# Patient Record
Sex: Male | Born: 1950 | Race: White | Hispanic: No | Marital: Married | Smoking: Former smoker
Health system: Southern US, Community
[De-identification: ages and names within clinical notes are randomized; demographics above are authoritative.]

---

## 2006-05-07 ENCOUNTER — Inpatient Hospital Stay (HOSPITAL_COMMUNITY): Admission: EM | Admit: 2006-05-07 | Discharge: 2006-05-09 | Payer: Self-pay | Admitting: Emergency Medicine

## 2006-06-13 ENCOUNTER — Encounter: Admission: RE | Admit: 2006-06-13 | Discharge: 2006-06-13 | Payer: Self-pay | Admitting: Family Medicine

## 2006-08-17 ENCOUNTER — Encounter: Admission: RE | Admit: 2006-08-17 | Discharge: 2006-08-17 | Payer: Self-pay | Admitting: Family Medicine

## 2010-07-23 NOTE — H&P (Signed)
NAMENGUYEN, TODOROV             ACCOUNT NO.:  [id number]   MEDICAL RECORD NO.:  [id number]          PATIENT TYPE:  INP   LOCATION:  1828                         FACILITY:  [facility]   PHYSICIAN:  Kela Millin, M.D.DATE OF BIRTH:  1950-03-10   DATE OF ADMISSION:  05/07/2006  DATE OF DISCHARGE:                              HISTORY & PHYSICAL   PRIMARY CARE PHYSICIAN:  Donia Guiles, M.D.   CHIEF COMPLAINT:  Presyncopal episode, cough, and fevers.   HISTORY OF PRESENT ILLNESS:  The patient is a 60 year old black male  with past medical history significant for tobacco abuse, who presents  with above complaints.  He states that he was in his usual state of  health until about a week and a half ago when he developed a cough along  with flu-like illness, generalized malaise, body aches, decreased  appetite, and subjective fevers.  After one week, he states that his  symptoms seemed to be improving, but then his condition began to decline  again.  He states that today he felt very weak and could not even get  through his shower, and his wife reported that he almost passed out,  became diaphoretic, pale.  No loss of consciousness reported.  He states  that his cough has just been occasionally productive of phlegm - clear  to white.  Admits to shortness of breath today.  He denies chest pain,  nausea or vomiting, abdominal pain, dysuria, melena, and no  hematochezia.  Per EMS, the patient was initially hypotensive on the  scene, with a blood pressure of 68/40, and received IV fluids, and upon  arrival in the ER his blood pressure was up to 105/54.  The patient was  also reported to be hypothermic per ER physician, and after a warming  blanket was used his temperature came up to 98.5.  A chest x-ray was  done in the ER, and it revealed right lower lobe pneumonia, influenza A  and B swabs negative.  His white cell count was elevated at 25.8.  He is  admitted to the [facility] for further evaluation and  management.   PAST MEDICAL HISTORY:  As above.   MEDICATIONS:  None.   ALLERGIES:  CODEINE.   SOCIAL HISTORY:  Positive for tobacco.  He smokes about 5 cigars a day.  States he has not smoked any in a week.  The patient also drinks 1-2  beers a day, but states he has not had any alcohol in greater than a  week.   FAMILY HISTORY:  His father is deceased.  He had lung cancer.  His  brother has prostate cancer.  His mother had an MI at age 46.   REVIEW OF SYSTEMS:  As per HPI.  Other review of systems negative.   PHYSICAL EXAMINATION:  GENERAL:  The patient is a middle-aged black  male.  He is alert and appropriate, in no respiratory distress.  VITAL SIGNS:  Temperature 98.5, blood pressure 109/60, pulse 89,  respiratory rate 16, initially 32, O2 saturation 97%.  HEENT:  PERRL.  EOMI.  Slightly dry mucous  membranes.  No oral exudates.  NECK:  Supple.  No adenopathy, no thyromegaly, and no JVD.  LUNGS:  Decreased breath sounds in the bases.  Moderate air movement.  No wheezes, and no crackles.  CARDIOVASCULAR:  Regular rate and rhythm.  Normal S1, S2.  ABDOMEN:  Soft.  Normal bowel sounds present.  Nontender, nondistended.  No organomegaly, and no masses palpable.  EXTREMITIES:  No cyanosis, and no edema.  NEUROLOGIC:  He is alert and oriented x3.  Cranial nerves II-XII grossly  intact.  Nonfocal exam.   LABORATORY DATA:  Chest x-ray:  Right lower lobe infiltrate, consistent  with pneumonia.  White cell count is 25.8, hemoglobin 13.3, hematocrit  38.6, platelet count 444, neutrophil count 91%.  Sodium is 138,  potassium 3.5, chloride 106, BUN 16, creatinine 1.2.  pH 7.36, PCO2  44.3.  Point of care markers negative x1.  His flu swab is negative for  influenza A and B.   ASSESSMENT AND PLAN:  1. Right lower lobe pneumonia.  Obtain blood cultures.  Continue IV      Zosyn.  Add expectorants and follow.  2. Presyncope.  Likely secondary to  hypotension initially, as      discussed above.  Resolved with IV fluids.  3. Tobacco abuse.  Smoking cessation counseling.      Kela Millin, M.D.  Electronically Signed     ACV/MEDQ  D:  05/07/2006  T:  05/07/2006  Job:  [id number]   cc:   Donia Guiles, M.D.

## 2011-09-19 ENCOUNTER — Other Ambulatory Visit: Payer: Self-pay | Admitting: Family Medicine

## 2011-09-19 DIAGNOSIS — R1310 Dysphagia, unspecified: Secondary | ICD-10-CM

## 2011-09-26 ENCOUNTER — Ambulatory Visit
Admission: RE | Admit: 2011-09-26 | Discharge: 2011-09-26 | Disposition: A | Discharge status: Home / Self Care | Payer: BC Managed Care – PPO | Source: Ambulatory Visit | Attending: Family Medicine | Admitting: Family Medicine

## 2011-09-26 DIAGNOSIS — R1310 Dysphagia, unspecified: Secondary | ICD-10-CM

## 2011-10-04 ENCOUNTER — Ambulatory Visit
Admission: RE | Admit: 2011-10-04 | Discharge: 2011-10-04 | Disposition: A | Discharge status: Home / Self Care | Payer: BC Managed Care – PPO | Source: Ambulatory Visit | Attending: Family Medicine | Admitting: Family Medicine

## 2014-12-16 DIAGNOSIS — R7301 Impaired fasting glucose: Secondary | ICD-10-CM | POA: Diagnosis not present

## 2014-12-16 DIAGNOSIS — E7800 Pure hypercholesterolemia, unspecified: Secondary | ICD-10-CM | POA: Diagnosis not present

## 2014-12-16 DIAGNOSIS — R109 Unspecified abdominal pain: Secondary | ICD-10-CM | POA: Diagnosis not present

## 2014-12-16 DIAGNOSIS — Z125 Encounter for screening for malignant neoplasm of prostate: Secondary | ICD-10-CM | POA: Diagnosis not present

## 2014-12-16 DIAGNOSIS — Z23 Encounter for immunization: Secondary | ICD-10-CM | POA: Diagnosis not present

## 2014-12-31 DIAGNOSIS — L570 Actinic keratosis: Secondary | ICD-10-CM | POA: Diagnosis not present

## 2014-12-31 DIAGNOSIS — X32XXXD Exposure to sunlight, subsequent encounter: Secondary | ICD-10-CM | POA: Diagnosis not present

## 2014-12-31 DIAGNOSIS — Z1283 Encounter for screening for malignant neoplasm of skin: Secondary | ICD-10-CM | POA: Diagnosis not present

## 2014-12-31 DIAGNOSIS — L219 Seborrheic dermatitis, unspecified: Secondary | ICD-10-CM | POA: Diagnosis not present

## 2015-01-15 DIAGNOSIS — Z0000 Encounter for general adult medical examination without abnormal findings: Secondary | ICD-10-CM | POA: Diagnosis not present

## 2015-01-15 DIAGNOSIS — R7301 Impaired fasting glucose: Secondary | ICD-10-CM | POA: Diagnosis not present

## 2015-01-15 DIAGNOSIS — G4700 Insomnia, unspecified: Secondary | ICD-10-CM | POA: Diagnosis not present

## 2015-01-15 DIAGNOSIS — J209 Acute bronchitis, unspecified: Secondary | ICD-10-CM | POA: Diagnosis not present

## 2015-01-15 DIAGNOSIS — K5730 Diverticulosis of large intestine without perforation or abscess without bleeding: Secondary | ICD-10-CM | POA: Diagnosis not present

## 2015-06-30 DIAGNOSIS — M931 Kienbock's disease of adults: Secondary | ICD-10-CM | POA: Diagnosis not present

## 2015-06-30 DIAGNOSIS — G5603 Carpal tunnel syndrome, bilateral upper limbs: Secondary | ICD-10-CM | POA: Diagnosis not present

## 2015-07-14 ENCOUNTER — Ambulatory Visit
Admission: RE | Admit: 2015-07-14 | Discharge: 2015-07-14 | Disposition: A | Discharge status: Home / Self Care | Payer: Medicare Other | Source: Ambulatory Visit | Attending: Physician Assistant | Admitting: Physician Assistant

## 2015-07-14 ENCOUNTER — Other Ambulatory Visit: Payer: Self-pay | Admitting: Physician Assistant

## 2015-07-14 DIAGNOSIS — M79645 Pain in left finger(s): Secondary | ICD-10-CM | POA: Diagnosis not present

## 2015-07-14 DIAGNOSIS — T1490XA Injury, unspecified, initial encounter: Secondary | ICD-10-CM

## 2015-07-14 DIAGNOSIS — L299 Pruritus, unspecified: Secondary | ICD-10-CM | POA: Diagnosis not present

## 2015-07-14 DIAGNOSIS — S6990XA Unspecified injury of unspecified wrist, hand and finger(s), initial encounter: Secondary | ICD-10-CM | POA: Diagnosis not present

## 2015-07-14 DIAGNOSIS — Z79899 Other long term (current) drug therapy: Secondary | ICD-10-CM | POA: Diagnosis not present

## 2015-07-14 DIAGNOSIS — S6992XA Unspecified injury of left wrist, hand and finger(s), initial encounter: Secondary | ICD-10-CM | POA: Diagnosis not present

## 2015-07-14 DIAGNOSIS — L309 Dermatitis, unspecified: Secondary | ICD-10-CM | POA: Diagnosis not present

## 2015-07-23 DIAGNOSIS — M20022 Boutonniere deformity of left finger(s): Secondary | ICD-10-CM | POA: Diagnosis not present

## 2015-07-23 DIAGNOSIS — M79645 Pain in left finger(s): Secondary | ICD-10-CM | POA: Diagnosis not present

## 2015-07-28 DIAGNOSIS — Z5181 Encounter for therapeutic drug level monitoring: Secondary | ICD-10-CM | POA: Diagnosis not present

## 2015-07-28 DIAGNOSIS — Z79899 Other long term (current) drug therapy: Secondary | ICD-10-CM | POA: Diagnosis not present

## 2015-07-28 DIAGNOSIS — L309 Dermatitis, unspecified: Secondary | ICD-10-CM | POA: Diagnosis not present

## 2015-07-30 DIAGNOSIS — M20022 Boutonniere deformity of left finger(s): Secondary | ICD-10-CM | POA: Diagnosis not present

## 2015-08-06 ENCOUNTER — Other Ambulatory Visit: Payer: Self-pay | Admitting: Dermatology

## 2015-08-06 DIAGNOSIS — L309 Dermatitis, unspecified: Secondary | ICD-10-CM | POA: Diagnosis not present

## 2015-08-06 DIAGNOSIS — L308 Other specified dermatitis: Secondary | ICD-10-CM | POA: Diagnosis not present

## 2015-08-06 DIAGNOSIS — D485 Neoplasm of uncertain behavior of skin: Secondary | ICD-10-CM | POA: Diagnosis not present

## 2015-08-06 DIAGNOSIS — R238 Other skin changes: Secondary | ICD-10-CM | POA: Diagnosis not present

## 2015-08-06 DIAGNOSIS — Z5181 Encounter for therapeutic drug level monitoring: Secondary | ICD-10-CM | POA: Diagnosis not present

## 2015-08-13 DIAGNOSIS — M20022 Boutonniere deformity of left finger(s): Secondary | ICD-10-CM | POA: Diagnosis not present

## 2015-08-26 DIAGNOSIS — G5602 Carpal tunnel syndrome, left upper limb: Secondary | ICD-10-CM | POA: Diagnosis not present

## 2015-09-01 DIAGNOSIS — M20022 Boutonniere deformity of left finger(s): Secondary | ICD-10-CM | POA: Diagnosis not present

## 2015-09-15 DIAGNOSIS — M79645 Pain in left finger(s): Secondary | ICD-10-CM | POA: Diagnosis not present

## 2015-09-15 DIAGNOSIS — M20022 Boutonniere deformity of left finger(s): Secondary | ICD-10-CM | POA: Diagnosis not present

## 2015-10-05 DIAGNOSIS — L259 Unspecified contact dermatitis, unspecified cause: Secondary | ICD-10-CM | POA: Diagnosis not present

## 2015-10-07 DIAGNOSIS — Z029 Encounter for administrative examinations, unspecified: Secondary | ICD-10-CM | POA: Diagnosis not present

## 2015-10-08 DIAGNOSIS — L259 Unspecified contact dermatitis, unspecified cause: Secondary | ICD-10-CM | POA: Diagnosis not present

## 2015-10-09 DIAGNOSIS — L259 Unspecified contact dermatitis, unspecified cause: Secondary | ICD-10-CM | POA: Diagnosis not present

## 2015-11-24 ENCOUNTER — Ambulatory Visit (INDEPENDENT_AMBULATORY_CARE_PROVIDER_SITE_OTHER): Payer: Medicare Other | Admitting: Podiatry

## 2015-11-24 ENCOUNTER — Ambulatory Visit (INDEPENDENT_AMBULATORY_CARE_PROVIDER_SITE_OTHER): Payer: Medicare Other

## 2015-11-24 VITALS — BP 120/70 | HR 70 | Resp 16 | Ht 73.0 in | Wt 164.0 lb

## 2015-11-24 DIAGNOSIS — M79673 Pain in unspecified foot: Secondary | ICD-10-CM

## 2015-11-24 DIAGNOSIS — M722 Plantar fascial fibromatosis: Secondary | ICD-10-CM | POA: Diagnosis not present

## 2015-11-24 MED ORDER — METHYLPREDNISOLONE 4 MG PO TBPK: ORAL_TABLET | ORAL | 0 refills | Status: DC

## 2015-11-24 MED ORDER — MELOXICAM 15 MG PO TABS: 15.0000 mg | ORAL_TABLET | Freq: Every day | ORAL | 3 refills | Status: DC

## 2015-11-24 NOTE — Patient Instructions (Signed)

## 2015-11-24 NOTE — Progress Notes (Signed)
   Subjective:    Patient ID: Stephen Gaines, male    DOB: 1951/02/03, 65 y.o.   MRN: [id number]  HPI  Chief Complaint  Patient presents with  . Foot Pain    right, sharp and burning x 2 mo  . Foot Pain    left ... pt states he fell from a latter 25 yr ago and "shattered" heel, but pain is more on the top and lateral side       Review of Systems  Hematological: Bruises/bleeds easily.  All other systems reviewed and are negative.      Objective:   Physical Exam: Vital signs are stable alert and oriented 3. Pulses are strongly palpable. Neurologic sensorium is intact presents once the monofilament. Deep tendon reflexes are intact. Muscle strength is 5 over 5 dorsiflexion plantar flexors and inverters everters all intrinsic musculature is intact. Orthopedic evaluation demonstrates severe pain on palpation medial calcaneal tubercle of the right heel radiographs confirm all fracture to the left heel and a soft tissue increase in density of the plantar fascia continues or site of the right heel with plantar distally oriented calcaneal heel spur. Cutaneous evaluation and x-rays no open lesions or wounds.        Assessment & Plan:  Assessment: Chronic pain to the left foot secondary to trauma in the past. Plantar fasciitis times several months right heel.  Plan: I injected the right heel today with local anesthetic and Kenalog. Discussed appropriate shoe gear stretcher sizes and ice therapy. We discussed the etiology pathology conservative versus surgical therapies. I also placed in the plantar fascia braces and a night splint. I also provided him with both oral and written stretching exercises. And a prescription for a Medrol Dosepak to be followed by meloxicam. Follow-up with her 1 month.

## 2015-12-07 DIAGNOSIS — M545 Low back pain: Secondary | ICD-10-CM | POA: Diagnosis not present

## 2015-12-11 DIAGNOSIS — Z23 Encounter for immunization: Secondary | ICD-10-CM | POA: Diagnosis not present

## 2015-12-22 ENCOUNTER — Ambulatory Visit (INDEPENDENT_AMBULATORY_CARE_PROVIDER_SITE_OTHER): Payer: Medicare Other | Admitting: Sports Medicine

## 2015-12-22 ENCOUNTER — Encounter: Payer: Self-pay | Admitting: Sports Medicine

## 2015-12-22 DIAGNOSIS — M79671 Pain in right foot: Secondary | ICD-10-CM

## 2015-12-22 DIAGNOSIS — M722 Plantar fascial fibromatosis: Secondary | ICD-10-CM | POA: Diagnosis not present

## 2015-12-22 NOTE — Patient Instructions (Signed)
Plantar Fasciitis Plantar fasciitis is a painful foot condition that affects the heel. It occurs when the band of tissue that connects the toes to the heel bone (plantar fascia) becomes irritated. This can happen after exercising too much or doing other repetitive activities (overuse injury). The pain from plantar fasciitis can range from mild irritation to severe pain that makes it difficult for you to walk or move. The pain is usually worse in the morning or after you have been sitting or lying down for a while. CAUSES This condition may be caused by:  Standing for long periods of time.  Wearing shoes that do not fit.  Doing high-impact activities, including running, aerobics, and ballet.  Being overweight.  Having an abnormal way of walking (gait).  Having tight calf muscles.  Having high arches in your feet.  Starting a new athletic activity. SYMPTOMS The main symptom of this condition is heel pain. Other symptoms include:  Pain that gets worse after activity or exercise.  Pain that is worse in the morning or after resting.  Pain that goes away after you walk for a few minutes. DIAGNOSIS This condition may be diagnosed based on your signs and symptoms. Your health care provider will also do a physical exam to check for:  A tender area on the bottom of your foot.  A high arch in your foot.  Pain when you move your foot.  Difficulty moving your foot. You may also need to have imaging studies to confirm the diagnosis. These can include:  X-rays.  Ultrasound.  MRI. TREATMENT  Treatment for plantar fasciitis depends on the severity of the condition. Your treatment may include:  Rest, ice, and over-the-counter pain medicines to manage your pain.  Exercises to stretch your calves and your plantar fascia.  A splint that holds your foot in a stretched, upward position while you sleep (night splint).  Physical therapy to relieve symptoms and prevent problems in the  future.  Cortisone injections to relieve severe pain.  Extracorporeal shock wave therapy (ESWT) to stimulate damaged plantar fascia with electrical impulses. It is often used as a last resort before surgery.  Surgery, if other treatments have not worked after 12 months. HOME CARE INSTRUCTIONS  Take medicines only as directed by your health care provider.  Avoid activities that cause pain.  Roll the bottom of your foot over a bag of ice or a bottle of cold water. Do this for 20 minutes, 3-4 times a day.  Perform simple stretches as directed by your health care provider.  Try wearing athletic shoes with air-sole or gel-sole cushions or soft shoe inserts.  Wear a night splint while sleeping, if directed by your health care provider.  Keep all follow-up appointments with your health care provider. PREVENTION   Do not perform exercises or activities that cause heel pain.  Consider finding low-impact activities if you continue to have problems.  Lose weight if you need to. The best way to prevent plantar fasciitis is to avoid the activities that aggravate your plantar fascia. SEEK MEDICAL CARE IF:  Your symptoms do not go away after treatment with home care measures.  Your pain gets worse.  Your pain affects your ability to move or do your daily activities.   This information is not intended to replace advice given to you by your health care provider. Make sure you discuss any questions you have with your health care provider.   Document Released: 11/16/2000 Document Revised: 11/12/2014 Document Reviewed: 01/01/2014 Elsevier   Interactive Patient Education 2016 [employer]  

## 2015-12-22 NOTE — Progress Notes (Addendum)
Subjective: Stephen Gaines is a 65 y.o. male returns to office for follow up evaluation after Right heel injection for plantar fasciitis, injection #1 administered 3-4weeks ago. Patient states that the injection did not help at all and does not want another. Patient states that he has not been doing stretches like he should because he was not given instructions and did not continue the meloxicam because he does not like how it makes him feet; takes Advil instead. Bought a compression sock that helps. History of Left heel fracture in past.  Patient denies any recent changes in medications or new problems since last visit.   There are no active problems to display for this patient.   Current Outpatient Prescriptions on File Prior to Visit  Medication Sig Dispense Refill  . meloxicam (MOBIC) 15 MG tablet Take 1 tablet (15 mg total) by mouth daily. 30 tablet 3  . methylPREDNISolone (MEDROL DOSEPAK) 4 MG TBPK tablet 6 day dose pack - take as directed 21 tablet 0   No current facility-administered medications on file prior to visit.     Allergies  Allergen Reactions  . Codeine Anaphylaxis  . Sulfamethoxazole Anaphylaxis  . Sulfa Antibiotics Nausea Only    Objective:   General:  Alert and oriented x 3, in no acute distress  Dermatology: Skin is warm, dry, and supple bilateral. Nails are within normal limits. There is no lower extremity erythema, no eccymosis, no open lesions present bilateral.   Vascular: Dorsalis Pedis and Posterior Tibial pedal pulses are 1/4 bilateral. + hair growth noted bilateral. Capillary Fill Time is 3 seconds in all digits. No varicosities, No edema bilateral lower extremities.   Neurological: Sensation grossly intact to light touch with an achilles reflex of +2 and a negative Tinel's sign bilateral. Vibratory, sharp/dull, Semmes Weinstein Monofilament within normal limits.   Musculoskeletal: There is tenderness to palpation at the medial calcaneal tubercale and  through the insertion of the plantar fascia on the right foot. No pain with compression to calcaneus or application of tuning fork. There is decreased Ankle joint range of motion bilateral. All other jointsrange of motion  within normal limits bilateral. Strength 5/5 bilateral.   Assessment and Plan: Problem List Items Addressed This Visit    None    Visit Diagnoses    Plantar fasciitis, right    -  Primary   Right foot pain          -Complete examination performed.  -Previous x-rays reviewed. -Discussed with patient in detail the condition of plantar fasciitis, how this occurs related to the foot type of the patient and general treatment options. - Patient declined injection -Continue with night splint and fascial brace -Continue with stretching, icing, good supportive shoes, inserts daily.  -Discussed long term care and reocurrence; will closely monitor; if fails to improve will consider other treatment modalities.  -Patient to return to office in 1 month for follow up or sooner if problems or questions arise. To consider PT vs EPAT if no improvement.   Landis Martins, DPM

## 2016-01-19 ENCOUNTER — Encounter: Payer: Self-pay | Admitting: Sports Medicine

## 2016-01-19 ENCOUNTER — Telehealth: Payer: Self-pay | Admitting: *Deleted

## 2016-01-19 ENCOUNTER — Ambulatory Visit (INDEPENDENT_AMBULATORY_CARE_PROVIDER_SITE_OTHER): Payer: Medicare Other | Admitting: Sports Medicine

## 2016-01-19 DIAGNOSIS — M79671 Pain in right foot: Secondary | ICD-10-CM | POA: Diagnosis not present

## 2016-01-19 DIAGNOSIS — M722 Plantar fascial fibromatosis: Secondary | ICD-10-CM

## 2016-01-19 NOTE — Telephone Encounter (Addendum)
-----   Message from Landis Martins, [place] sent at 01/19/2016  9:13 AM EST ----- Regarding: PT with benchmark Right plantar fasciitis Strengthening gait and balance modalities as needed dry needling, astym  2x per week 4 weeks -Dr. Cannon Kettle.  Faxed orders for PT to [facility].

## 2016-01-19 NOTE — Progress Notes (Signed)
  Subjective: Stephen Gaines is a 65 y.o. male returns to office for follow up evaluation after Right heel injection for plantar fasciitis, injection #1 administered 8 weeks ago. Patient states that the injection did not help at all and does not want another as previous and states that his heel still hurts. Patient states that he has been stretching and doing better with wearing splints and braces as well but still hurts.  Patient denies any recent changes in medications or new problems since last visit.   There are no active problems to display for this patient.   Current Outpatient Prescriptions on File Prior to Visit  Medication Sig Dispense Refill  . cyclobenzaprine (FLEXERIL) 10 MG tablet     . dapsone 100 MG tablet     . meloxicam (MOBIC) 15 MG tablet Take 1 tablet (15 mg total) by mouth daily. 30 tablet 3  . methylPREDNISolone (MEDROL DOSEPAK) 4 MG TBPK tablet 6 day dose pack - take as directed 21 tablet 0   No current facility-administered medications on file prior to visit.     Allergies  Allergen Reactions  . Codeine Anaphylaxis  . Sulfamethoxazole Anaphylaxis  . Sulfa Antibiotics Nausea Only    Objective:   General:  Alert and oriented x 3, in no acute distress  Dermatology: Skin is warm, dry, and supple bilateral. Nails are within normal limits. There is no lower extremity erythema, no eccymosis, no open lesions present bilateral.   Vascular: Dorsalis Pedis and Posterior Tibial pedal pulses are 1/4 bilateral. + hair growth noted bilateral. Capillary Fill Time is 3 seconds in all digits. No varicosities, No edema bilateral lower extremities.   Neurological: Sensation grossly intact to light touch with an achilles reflex of +2 and a negative Tinel's sign bilateral. Vibratory, sharp/dull, Semmes Weinstein Monofilament within normal limits.   Musculoskeletal: There is tenderness to palpation at the medial calcaneal tubercale and through the insertion of the plantar fascia  on the right foot. No pain with compression to calcaneus or application of tuning fork. There is decreased Ankle joint range of motion bilateral. All other jointsrange of motion  within normal limits bilateral. Strength 5/5 bilateral.   Assessment and Plan: Problem List Items Addressed This Visit    None    Visit Diagnoses    Plantar fasciitis, right    -  Primary   Right foot pain         -Complete examination performed.  -Discussed with patient in detail the condition of plantar fasciitis, how this occurs related to the foot type of the patient and general treatment options. - Patient declined injection again today -Rx PT with benchmark  -Continue with night splint and fascial brace -Continue with stretching, icing, good supportive shoes, inserts daily.  -Discussed long term care and reocurrence; will closely monitor; if fails to improve will consider other treatment modalities.  -Patient to return to office in 1 month for follow up or sooner if problems or questions arise. To consider EPAT vs Surgery if no improvement.   Landis Martins, DPM

## 2016-01-21 DIAGNOSIS — M79671 Pain in right foot: Secondary | ICD-10-CM | POA: Diagnosis not present

## 2016-01-21 DIAGNOSIS — M25671 Stiffness of right ankle, not elsewhere classified: Secondary | ICD-10-CM | POA: Diagnosis not present

## 2016-01-21 DIAGNOSIS — M62571 Muscle wasting and atrophy, not elsewhere classified, right ankle and foot: Secondary | ICD-10-CM | POA: Diagnosis not present

## 2016-01-21 DIAGNOSIS — M62562 Muscle wasting and atrophy, not elsewhere classified, left lower leg: Secondary | ICD-10-CM | POA: Diagnosis not present

## 2016-01-25 DIAGNOSIS — M79671 Pain in right foot: Secondary | ICD-10-CM | POA: Diagnosis not present

## 2016-01-25 DIAGNOSIS — M25671 Stiffness of right ankle, not elsewhere classified: Secondary | ICD-10-CM | POA: Diagnosis not present

## 2016-01-25 DIAGNOSIS — M62562 Muscle wasting and atrophy, not elsewhere classified, left lower leg: Secondary | ICD-10-CM | POA: Diagnosis not present

## 2016-01-25 DIAGNOSIS — M62571 Muscle wasting and atrophy, not elsewhere classified, right ankle and foot: Secondary | ICD-10-CM | POA: Diagnosis not present

## 2016-01-26 DIAGNOSIS — M79671 Pain in right foot: Secondary | ICD-10-CM | POA: Diagnosis not present

## 2016-01-26 DIAGNOSIS — M25671 Stiffness of right ankle, not elsewhere classified: Secondary | ICD-10-CM | POA: Diagnosis not present

## 2016-01-26 DIAGNOSIS — M62571 Muscle wasting and atrophy, not elsewhere classified, right ankle and foot: Secondary | ICD-10-CM | POA: Diagnosis not present

## 2016-01-26 DIAGNOSIS — M62562 Muscle wasting and atrophy, not elsewhere classified, left lower leg: Secondary | ICD-10-CM | POA: Diagnosis not present

## 2016-02-02 DIAGNOSIS — M62562 Muscle wasting and atrophy, not elsewhere classified, left lower leg: Secondary | ICD-10-CM | POA: Diagnosis not present

## 2016-02-02 DIAGNOSIS — M25671 Stiffness of right ankle, not elsewhere classified: Secondary | ICD-10-CM | POA: Diagnosis not present

## 2016-02-02 DIAGNOSIS — M79671 Pain in right foot: Secondary | ICD-10-CM | POA: Diagnosis not present

## 2016-02-02 DIAGNOSIS — M62571 Muscle wasting and atrophy, not elsewhere classified, right ankle and foot: Secondary | ICD-10-CM | POA: Diagnosis not present

## 2016-02-04 DIAGNOSIS — M79671 Pain in right foot: Secondary | ICD-10-CM | POA: Diagnosis not present

## 2016-02-04 DIAGNOSIS — M62571 Muscle wasting and atrophy, not elsewhere classified, right ankle and foot: Secondary | ICD-10-CM | POA: Diagnosis not present

## 2016-02-04 DIAGNOSIS — M62562 Muscle wasting and atrophy, not elsewhere classified, left lower leg: Secondary | ICD-10-CM | POA: Diagnosis not present

## 2016-02-04 DIAGNOSIS — M25671 Stiffness of right ankle, not elsewhere classified: Secondary | ICD-10-CM | POA: Diagnosis not present

## 2016-02-09 DIAGNOSIS — M62562 Muscle wasting and atrophy, not elsewhere classified, left lower leg: Secondary | ICD-10-CM | POA: Diagnosis not present

## 2016-02-09 DIAGNOSIS — M79671 Pain in right foot: Secondary | ICD-10-CM | POA: Diagnosis not present

## 2016-02-09 DIAGNOSIS — M62571 Muscle wasting and atrophy, not elsewhere classified, right ankle and foot: Secondary | ICD-10-CM | POA: Diagnosis not present

## 2016-02-09 DIAGNOSIS — M25671 Stiffness of right ankle, not elsewhere classified: Secondary | ICD-10-CM | POA: Diagnosis not present

## 2016-02-16 ENCOUNTER — Encounter: Payer: Self-pay | Admitting: Sports Medicine

## 2016-02-16 ENCOUNTER — Ambulatory Visit (INDEPENDENT_AMBULATORY_CARE_PROVIDER_SITE_OTHER): Payer: Medicare Other | Admitting: Sports Medicine

## 2016-02-16 DIAGNOSIS — M79671 Pain in right foot: Secondary | ICD-10-CM

## 2016-02-16 DIAGNOSIS — M722 Plantar fascial fibromatosis: Secondary | ICD-10-CM | POA: Diagnosis not present

## 2016-02-16 NOTE — Progress Notes (Signed)
  Subjective: Stephen Gaines is a 65 y.o. male returns to office for follow up evaluation after Right heel injection for plantar fasciitis, injection #1 administered 12 weeks ago. Patient states that the injection did not help and PT seems to be helping with some good days and bad days. Reports PT told him to stop wearing the brace and last flare wp was with standing in kitchen last night.  Patient denies any recent changes in medications or new problems since last visit.   There are no active problems to display for this patient.   Current Outpatient Prescriptions on File Prior to Visit  Medication Sig Dispense Refill  . cyclobenzaprine (FLEXERIL) 10 MG tablet     . dapsone 100 MG tablet     . meloxicam (MOBIC) 15 MG tablet Take 1 tablet (15 mg total) by mouth daily. 30 tablet 3  . methylPREDNISolone (MEDROL DOSEPAK) 4 MG TBPK tablet 6 day dose pack - take as directed 21 tablet 0   No current facility-administered medications on file prior to visit.     Allergies  Allergen Reactions  . Codeine Anaphylaxis  . Sulfamethoxazole Anaphylaxis  . Sulfa Antibiotics Nausea Only    Objective:   General:  Alert and oriented x 3, in no acute distress  Dermatology: Skin is warm, dry, and supple bilateral. Nails are within normal limits. There is no lower extremity erythema, no eccymosis, no open lesions present bilateral.   Vascular: Dorsalis Pedis and Posterior Tibial pedal pulses are 1/4 bilateral. + hair growth noted bilateral. Capillary Fill Time is 3 seconds in all digits. No varicosities, No edema bilateral lower extremities.   Neurological: Sensation grossly intact to light touch with an achilles reflex of +2 and a negative Tinel's sign bilateral. Vibratory, sharp/dull, Semmes Weinstein Monofilament within normal limits.   Musculoskeletal: There is tenderness to palpation at the medial calcaneal tubercale and through the insertion of the plantar fascia on the right foot. No pain with  compression to calcaneus or application of tuning fork. There is decreased Ankle joint range of motion bilateral. All other jointsrange of motion  within normal limits bilateral. Strength 5/5 bilateral.   Assessment and Plan: Problem List Items Addressed This Visit    None    Visit Diagnoses    Plantar fasciitis, right    -  Primary   Right foot pain         -Complete examination performed.  -Discussed with patient in detail the condition of plantar fasciitis, how this occurs related to the foot type of the patient and general treatment options. - Patient declined injection again today -Continue with PT with benchmark. Patient also expresses that he might want to try a different therapist; I advised patient the importance of consistent care and if he wants to try another therapist to let office know if Rx is needed  -Continue with night splint and fascial brace as tolerated or based on PT recs -Continue with stretching, icing, good supportive shoes, inserts daily.  -Discussed long term care and reocurrence; will closely monitor; if fails to improve will consider other treatment modalities.  -Patient to return to office in 6 weeks for follow up or sooner if problems or questions arise. To consider EPAT vs Surgery if no improvement.   Landis Martins, DPM

## 2016-02-23 DIAGNOSIS — M79671 Pain in right foot: Secondary | ICD-10-CM | POA: Diagnosis not present

## 2016-02-23 DIAGNOSIS — M62571 Muscle wasting and atrophy, not elsewhere classified, right ankle and foot: Secondary | ICD-10-CM | POA: Diagnosis not present

## 2016-02-23 DIAGNOSIS — M25671 Stiffness of right ankle, not elsewhere classified: Secondary | ICD-10-CM | POA: Diagnosis not present

## 2016-02-23 DIAGNOSIS — M62562 Muscle wasting and atrophy, not elsewhere classified, left lower leg: Secondary | ICD-10-CM | POA: Diagnosis not present

## 2016-03-25 DIAGNOSIS — Z125 Encounter for screening for malignant neoplasm of prostate: Secondary | ICD-10-CM | POA: Diagnosis not present

## 2016-03-25 DIAGNOSIS — K5730 Diverticulosis of large intestine without perforation or abscess without bleeding: Secondary | ICD-10-CM | POA: Diagnosis not present

## 2016-03-25 DIAGNOSIS — G4700 Insomnia, unspecified: Secondary | ICD-10-CM | POA: Diagnosis not present

## 2016-03-25 DIAGNOSIS — Z87891 Personal history of nicotine dependence: Secondary | ICD-10-CM | POA: Diagnosis not present

## 2016-03-25 DIAGNOSIS — Z23 Encounter for immunization: Secondary | ICD-10-CM | POA: Diagnosis not present

## 2016-03-25 DIAGNOSIS — R7301 Impaired fasting glucose: Secondary | ICD-10-CM | POA: Diagnosis not present

## 2016-03-25 DIAGNOSIS — L309 Dermatitis, unspecified: Secondary | ICD-10-CM | POA: Diagnosis not present

## 2016-03-25 DIAGNOSIS — Z0000 Encounter for general adult medical examination without abnormal findings: Secondary | ICD-10-CM | POA: Diagnosis not present

## 2016-03-25 DIAGNOSIS — M722 Plantar fascial fibromatosis: Secondary | ICD-10-CM | POA: Diagnosis not present

## 2016-03-29 ENCOUNTER — Ambulatory Visit: Payer: Medicare Other | Admitting: Sports Medicine

## 2016-04-12 DIAGNOSIS — M722 Plantar fascial fibromatosis: Secondary | ICD-10-CM | POA: Diagnosis not present

## 2016-04-12 DIAGNOSIS — M25571 Pain in right ankle and joints of right foot: Secondary | ICD-10-CM | POA: Diagnosis not present

## 2016-04-13 ENCOUNTER — Other Ambulatory Visit: Payer: Self-pay | Admitting: Acute Care

## 2016-04-13 DIAGNOSIS — Z87891 Personal history of nicotine dependence: Secondary | ICD-10-CM

## 2016-04-15 DIAGNOSIS — M722 Plantar fascial fibromatosis: Secondary | ICD-10-CM | POA: Diagnosis not present

## 2016-04-15 DIAGNOSIS — M25571 Pain in right ankle and joints of right foot: Secondary | ICD-10-CM | POA: Diagnosis not present

## 2016-04-18 ENCOUNTER — Encounter: Payer: Self-pay | Admitting: Acute Care

## 2016-04-18 ENCOUNTER — Ambulatory Visit (INDEPENDENT_AMBULATORY_CARE_PROVIDER_SITE_OTHER)
Admission: RE | Admit: 2016-04-18 | Discharge: 2016-04-18 | Disposition: A | Discharge status: Home / Self Care | Payer: Medicare Other | Source: Ambulatory Visit | Attending: Acute Care | Admitting: Acute Care

## 2016-04-18 ENCOUNTER — Ambulatory Visit (INDEPENDENT_AMBULATORY_CARE_PROVIDER_SITE_OTHER): Payer: Medicare Other | Admitting: Acute Care

## 2016-04-18 DIAGNOSIS — Z87891 Personal history of nicotine dependence: Secondary | ICD-10-CM | POA: Diagnosis not present

## 2016-04-18 DIAGNOSIS — M722 Plantar fascial fibromatosis: Secondary | ICD-10-CM | POA: Diagnosis not present

## 2016-04-18 DIAGNOSIS — M25571 Pain in right ankle and joints of right foot: Secondary | ICD-10-CM | POA: Diagnosis not present

## 2016-04-18 NOTE — Progress Notes (Signed)
Shared Decision Making Visit Lung Cancer Screening Program [id number])   Eligibility:  Age : 66, former smoker, quit 2006  Pack Years Smoking History Calculation 45 pack years smoking history (# packs/per year x # years smoked)  Recent History of coughing up blood  no  Unexplained weight loss? no ( >Than 15 pounds within the last 6 months )  Prior History Lung / other cancer no (Diagnosis within the last 5 years already requiring surveillance chest CT Scans).  Smoking Status Former Smoker  Former Smokers: Years since quit: 12 years  Quit Date: 03/07/2005  Visit Components:  Discussion included one or more decision making aids. yes  Discussion included risk/benefits of screening. yes  Discussion included potential follow up diagnostic testing for abnormal scans. yes  Discussion included meaning and risk of over diagnosis. yes  Discussion included meaning and risk of False Positives. yes  Discussion included meaning of total radiation exposure. yes  Counseling Included:  Importance of adherence to annual lung cancer LDCT screening. yes  Impact of comorbidities on ability to participate in the program. yes  Ability and willingness to under diagnostic treatment. yes  Smoking Cessation Counseling:  Current Smokers:   Discussed importance of smoking cessation. NA  Information about tobacco cessation classes and interventions provided to patient. yes  Patient provided with "ticket" for LDCT Scan. yes  Symptomatic Patient. no  Counseling NA  Diagnosis Code: Tobacco Use Z72.0  Asymptomatic Patient yes  Counseling (Intermediate counseling: > three minutes counseling) [id number]  Former Smokers:   Discussed the importance of maintaining cigarette abstinence. yes  Diagnosis Code: Personal History of Nicotine Dependence. [id number]  Information about tobacco cessation classes and interventions provided to patient. Yes  Patient provided with "ticket" for LDCT Scan.  yes  Written Order for Lung Cancer Screening with LDCT placed in Epic. Yes (CT Chest Lung Cancer Screening Low Dose W/O CM) [id number] Z12.2-Screening of respiratory organs Z87.891-Personal history of nicotine dependence  I spent 25 minutes of face to face time with Alvira Monday discussing the risks and benefits of lung cancer screening. We viewed a power point together that explained in detail the above noted topics. We took the time to pause the power point at intervals to allow for questions to be asked and answered to ensure understanding. We discussed that he had taken the single most powerful action possible to decrease his risk of developing lung cancer when he quit smoking. I counseled him to remain smoke free, and to contact me if he ever had the desire to smoke again so that I can provide resources and tools to help support the effort to remain smoke free. We discussed the time and location of the scan, and that either [place] or I will call with the results within  24-48 hours of receiving them. [facility] has my card and contact information in the event he needs to speak with me, in addition to a copy of the power point we reviewed as a resource. Mr. Schey verbalized understanding of all of the above and had no further questions upon leaving the office.  I spent 3-4 minutes on encouraging patient to remain smoke free and abstain from tobacco use.   Magdalen Spatz, NP 04/18/2016

## 2016-04-19 ENCOUNTER — Other Ambulatory Visit: Payer: Self-pay | Admitting: Acute Care

## 2016-04-19 ENCOUNTER — Telehealth: Payer: Self-pay | Admitting: Acute Care

## 2016-04-19 DIAGNOSIS — Z87891 Personal history of nicotine dependence: Secondary | ICD-10-CM

## 2016-04-19 NOTE — Telephone Encounter (Signed)
Per Eric Form: Please call Stephen Gaines and let him know his CT was read as a Lung RADS 2: nodules that are benign in appearance and behavior with a very low likelihood of becoming a clinically active cancer due to size or lack of growth. Recommendation per radiology is for a repeat LDCT in 12 months.We will order and schedule the scan the scan for 12 months. ( Feb. 2019). The scan also suggested underlying Emphysema/ COPD due to his history of smoking. Please let him know we will fax a copy of the scan to his PCP. Thanks so much  Spoke with pt and informed of CT results.  Pt verbalized understanding.  Nothing further needed at this time.

## 2016-04-21 DIAGNOSIS — M25571 Pain in right ankle and joints of right foot: Secondary | ICD-10-CM | POA: Diagnosis not present

## 2016-04-21 DIAGNOSIS — M722 Plantar fascial fibromatosis: Secondary | ICD-10-CM | POA: Diagnosis not present

## 2016-04-26 DIAGNOSIS — M25571 Pain in right ankle and joints of right foot: Secondary | ICD-10-CM | POA: Diagnosis not present

## 2016-04-26 DIAGNOSIS — M722 Plantar fascial fibromatosis: Secondary | ICD-10-CM | POA: Diagnosis not present

## 2016-06-06 ENCOUNTER — Encounter (INDEPENDENT_AMBULATORY_CARE_PROVIDER_SITE_OTHER): Payer: Self-pay

## 2016-06-06 ENCOUNTER — Ambulatory Visit: Payer: Self-pay

## 2016-06-06 ENCOUNTER — Ambulatory Visit (INDEPENDENT_AMBULATORY_CARE_PROVIDER_SITE_OTHER): Payer: Medicare Other | Admitting: Sports Medicine

## 2016-06-06 ENCOUNTER — Encounter: Payer: Self-pay | Admitting: Sports Medicine

## 2016-06-06 VITALS — BP 123/84 | Ht 73.0 in | Wt 170.0 lb

## 2016-06-06 DIAGNOSIS — M722 Plantar fascial fibromatosis: Secondary | ICD-10-CM

## 2016-06-06 DIAGNOSIS — M79671 Pain in right foot: Secondary | ICD-10-CM | POA: Diagnosis not present

## 2016-06-06 NOTE — Assessment & Plan Note (Signed)
-   Chronic. Exam consistent with plantar fasciitis, likely worsened by long-standing compensation with R leg favoring 2/2 L foot trauma. - Dr. Oneida Alar performed US, which revealed heel spurring of R calcaneous and R midfoot that extended into plantar fascia - Padding applied to patient's shoe inserts to offload pressure from L heel, R heel and R midfoot

## 2016-06-06 NOTE — Progress Notes (Addendum)
[facility] Progress Note  Subjective:  Stephen Gaines is a 66 y.o. male with history of shattered left heel about 25 years ago s/p surgical repair who presents for R foot pain. Patient has pain across plantar surface of foot, worst at lateral calcaneous and midfoot. Symptoms began around August of last year. He has been treated for plantar fasciitis with kenalog injection followed by week-long steroid taper, without relief. He has tried a heel cup in his shoe and night splints. He has also tried physical therapy twice. None of these interventions have provided much relief. He feels most comfortable when wearing cushioned sandals. He is able to walk longer distances but often has to walk more on his toes to avoid pain. He denies specific injury to the area.   ROS: No fevers, no falls  Social: Former smoker  Objective: Blood pressure 123/84, height 6\' 1"  (1.854 m), weight 170 lb (77.1 kg). Constitutional: Thin, well-appearing male in NAD Musculoskeletal: Plantar and dorsiflexion strength 5/5 bilaterally. ROM somewhat limited for inversion and eversion of feet bilaterally. High arches. R calf > L calf. Regarding gait, does not pick up left foot as well as right. Point TTP over medial and lateral aspects of R calcaneous and across midfoot.  Neurological: Sensation of bilateral feet intact.  Skin: Skin is warm and dry. No bruising or erythema noted. Surgical scar of L ankle.  Psychiatric: Normal mood and affect.  Vitals reviewed  R foot x-ray 11/24/15 showing some heel spurring and thickened plantar fascia   Ultrasound of Right Heel  Patient has thickening of Plantar fascia at 0.66 There is a calcified spur that intersects the midlimb of plantar fascia Some hypoechoic change noted Mild thickening distal to this area  Impression - Ultrasound consistent with chronic plantar fasciitis with spur impingment  Ultrasound and interpretation by Wolfgang Phoenix. Fields,  MD    Assessment/Plan: Right foot pain - Chronic. Exam consistent with plantar fasciitis, likely worsened by long-standing compensation with R leg favoring 2/2 L foot trauma. - Dr. Oneida Alar performed US, which revealed heel spurring of R calcaneous and R midfoot that extended into plantar fascia - Padding applied to patient's shoe inserts to offload pressure from L heel, R heel and R midfoot  Follow-up in a few weeks for custom orthotic fitting if not much improvement.  Olene Floss, MD [place], PGY-2  I observed and examined the patient with the resident and agree with assessment and plan.  Note reviewed and modified by me. Stefanie Libel, MD

## 2016-06-14 ENCOUNTER — Ambulatory Visit: Payer: Medicare Other | Admitting: Sports Medicine

## 2016-07-28 ENCOUNTER — Ambulatory Visit (INDEPENDENT_AMBULATORY_CARE_PROVIDER_SITE_OTHER): Payer: Medicare Other | Admitting: Sports Medicine

## 2016-07-28 DIAGNOSIS — M722 Plantar fascial fibromatosis: Secondary | ICD-10-CM | POA: Diagnosis not present

## 2016-07-28 NOTE — Assessment & Plan Note (Signed)
Custom orthotics made with a doughnut hole for plantar fascia. See note below.

## 2016-07-28 NOTE — Progress Notes (Signed)
  Stephen Gaines - 66 y.o. male MRN [id number]  Date of birth: [birth year]  SUBJECTIVE:  Including CC & ROS.  CC: Right plantar fasciitis  Presents with right foot pain that is been diagnosis plantar fasciitis in the past. He was given insoles with scaphoid pads and heel is with a [place] hole to take pressure off heel spurs from plantar fasciitis. He has done better with this. He has a history of fusion of his left ankle due to an accident years ago.  He presents for custom orthotics.   ROS: No unexpected weight loss, fever, chills, swelling, instability, muscle pain, numbness/tingling, redness, otherwise see HPI   PMHx - Updated and reviewed.  Contributory factors include: Negative PSHx - Updated and reviewed.  Contributory factors include:  Negative FHx - Updated and reviewed.  Contributory factors include:  Negative Social Hx - Updated and reviewed. Contributory factors include: Negative Medications - reviewed   DATA REVIEWED: Previous office visits  PHYSICAL EXAM:  VS: BP:124/66  HR: bpm  TEMP: ( )  RESP:   HT:6\' 1"  (185.4 cm)   WT:170 lb (77.1 kg)  BMI:22.5 PHYSICAL EXAM: Gen: NAD, alert, cooperative with exam, well-appearing HEENT: clear conjunctiva,  CV:  no edema, capillary refill brisk, normal rate Resp: non-labored Skin: no rashes, normal turgor  Neuro: no gross deficits.  Psych:  alert and oriented  Ankle & Foot: No visible swelling, ecchymosis, erythema, ulcers, calluses, blister Arch:  pes cavus  Limited to no motion of his left ankle Sensation: intact Vascular: intact w/ dorsalis pedis & posterior tibialis pulses 2+ Tenderness to palpation at origin of plantar fascia at calcaneus     ASSESSMENT & PLAN:   Plantar fasciitis of right foot Custom orthotics made with a doughnut hole for plantar fascia. See note below.    Patient was fitted for a standard, cushioned, semi-rigid orthotic. The orthotic was heated and afterward the patient stood on the  orthotic blank positioned on the orthotic stand. The patient was positioned in subtalar neutral position and 10 degrees of ankle dorsiflexion in a weight bearing stance. After completion of molding, a stable base was applied to the orthotic blank. The blank was ground to a stable position for weight bearing. Size: 11 Base: Blue EVA Additional Posting and Padding: 3/16 inch heel lifts with a doughnut hole in the right one under the plantar fascia The patient ambulated these, and they were very comfortable.  I spent 40 minutes with this patient, greater than 50% was face-to-face time counseling regarding the below diagnosis.

## 2016-10-20 DIAGNOSIS — L814 Other melanin hyperpigmentation: Secondary | ICD-10-CM | POA: Diagnosis not present

## 2016-10-20 DIAGNOSIS — D692 Other nonthrombocytopenic purpura: Secondary | ICD-10-CM | POA: Diagnosis not present

## 2016-10-20 DIAGNOSIS — D225 Melanocytic nevi of trunk: Secondary | ICD-10-CM | POA: Diagnosis not present

## 2016-10-20 DIAGNOSIS — L821 Other seborrheic keratosis: Secondary | ICD-10-CM | POA: Diagnosis not present

## 2016-10-20 DIAGNOSIS — L309 Dermatitis, unspecified: Secondary | ICD-10-CM | POA: Diagnosis not present

## 2016-12-29 DIAGNOSIS — Z23 Encounter for immunization: Secondary | ICD-10-CM | POA: Diagnosis not present

## 2017-01-19 ENCOUNTER — Encounter (INDEPENDENT_AMBULATORY_CARE_PROVIDER_SITE_OTHER): Payer: Self-pay

## 2017-01-30 ENCOUNTER — Other Ambulatory Visit: Payer: Self-pay | Admitting: Physician Assistant

## 2017-01-30 ENCOUNTER — Ambulatory Visit
Admission: RE | Admit: 2017-01-30 | Discharge: 2017-01-30 | Disposition: A | Discharge status: Home / Self Care | Payer: Medicare Other | Source: Ambulatory Visit | Attending: Physician Assistant | Admitting: Physician Assistant

## 2017-01-30 DIAGNOSIS — M25561 Pain in right knee: Secondary | ICD-10-CM | POA: Diagnosis not present

## 2017-01-30 DIAGNOSIS — R52 Pain, unspecified: Secondary | ICD-10-CM

## 2017-01-30 DIAGNOSIS — M25512 Pain in left shoulder: Secondary | ICD-10-CM | POA: Diagnosis not present

## 2017-02-02 ENCOUNTER — Ambulatory Visit
Admission: RE | Admit: 2017-02-02 | Discharge: 2017-02-02 | Disposition: A | Discharge status: Home / Self Care | Payer: Medicare Other | Source: Ambulatory Visit | Attending: Physician Assistant | Admitting: Physician Assistant

## 2017-02-02 DIAGNOSIS — M25561 Pain in right knee: Secondary | ICD-10-CM

## 2017-02-06 DIAGNOSIS — M25561 Pain in right knee: Secondary | ICD-10-CM | POA: Diagnosis not present

## 2017-03-13 DIAGNOSIS — M25861 Other specified joint disorders, right knee: Secondary | ICD-10-CM | POA: Diagnosis not present

## 2017-04-06 ENCOUNTER — Other Ambulatory Visit: Payer: Self-pay | Admitting: Family Medicine

## 2017-04-06 ENCOUNTER — Ambulatory Visit
Admission: RE | Admit: 2017-04-06 | Discharge: 2017-04-06 | Disposition: A | Discharge status: Home / Self Care | Payer: Medicare Other | Source: Ambulatory Visit | Attending: Family Medicine | Admitting: Family Medicine

## 2017-04-06 DIAGNOSIS — M542 Cervicalgia: Secondary | ICD-10-CM

## 2017-04-06 DIAGNOSIS — Z125 Encounter for screening for malignant neoplasm of prostate: Secondary | ICD-10-CM | POA: Diagnosis not present

## 2017-04-06 DIAGNOSIS — R2241 Localized swelling, mass and lump, right lower limb: Secondary | ICD-10-CM | POA: Diagnosis not present

## 2017-04-06 DIAGNOSIS — G4700 Insomnia, unspecified: Secondary | ICD-10-CM | POA: Diagnosis not present

## 2017-04-06 DIAGNOSIS — E7889 Other lipoprotein metabolism disorders: Secondary | ICD-10-CM | POA: Diagnosis not present

## 2017-04-06 DIAGNOSIS — R7301 Impaired fasting glucose: Secondary | ICD-10-CM | POA: Diagnosis not present

## 2017-04-06 DIAGNOSIS — R229 Localized swelling, mass and lump, unspecified: Secondary | ICD-10-CM | POA: Diagnosis not present

## 2017-04-06 DIAGNOSIS — Z0000 Encounter for general adult medical examination without abnormal findings: Secondary | ICD-10-CM | POA: Diagnosis not present

## 2017-04-13 ENCOUNTER — Other Ambulatory Visit: Payer: Medicare Other

## 2017-04-17 ENCOUNTER — Ambulatory Visit
Admission: RE | Admit: 2017-04-17 | Discharge: 2017-04-17 | Disposition: A | Discharge status: Home / Self Care | Payer: Medicare Other | Source: Ambulatory Visit | Attending: Family Medicine | Admitting: Family Medicine

## 2017-04-17 DIAGNOSIS — R229 Localized swelling, mass and lump, unspecified: Secondary | ICD-10-CM

## 2017-04-17 DIAGNOSIS — G5741 Lesion of medial popliteal nerve, right lower limb: Secondary | ICD-10-CM | POA: Diagnosis not present

## 2017-04-17 MED ORDER — GADOBENATE DIMEGLUMINE 529 MG/ML IV SOLN
16.0000 mL | Freq: Once | INTRAVENOUS | Status: AC | PRN
  Administered 2017-04-17: 16 mL via INTRAVENOUS

## 2017-04-24 ENCOUNTER — Ambulatory Visit (INDEPENDENT_AMBULATORY_CARE_PROVIDER_SITE_OTHER)
Admission: RE | Admit: 2017-04-24 | Discharge: 2017-04-24 | Disposition: A | Discharge status: Home / Self Care | Payer: Medicare Other | Source: Ambulatory Visit | Attending: Acute Care | Admitting: Acute Care

## 2017-04-24 DIAGNOSIS — Z87891 Personal history of nicotine dependence: Secondary | ICD-10-CM | POA: Diagnosis not present

## 2017-05-01 ENCOUNTER — Telehealth: Payer: Self-pay | Admitting: Acute Care

## 2017-05-01 DIAGNOSIS — Z87891 Personal history of nicotine dependence: Secondary | ICD-10-CM

## 2017-05-01 DIAGNOSIS — Z122 Encounter for screening for malignant neoplasm of respiratory organs: Secondary | ICD-10-CM

## 2017-05-01 NOTE — Telephone Encounter (Signed)
Pt informed of CT results per Sarah Groce, NP.  PT verbalized understanding.  Copy sent to PCP.  Order placed for 1 yr f/u CT.  

## 2017-07-20 DIAGNOSIS — L570 Actinic keratosis: Secondary | ICD-10-CM | POA: Diagnosis not present

## 2017-10-20 DIAGNOSIS — L821 Other seborrheic keratosis: Secondary | ICD-10-CM | POA: Diagnosis not present

## 2017-10-20 DIAGNOSIS — L812 Freckles: Secondary | ICD-10-CM | POA: Diagnosis not present

## 2017-10-20 DIAGNOSIS — D229 Melanocytic nevi, unspecified: Secondary | ICD-10-CM | POA: Diagnosis not present

## 2017-10-20 DIAGNOSIS — L814 Other melanin hyperpigmentation: Secondary | ICD-10-CM | POA: Diagnosis not present

## 2017-10-20 DIAGNOSIS — D1801 Hemangioma of skin and subcutaneous tissue: Secondary | ICD-10-CM | POA: Diagnosis not present

## 2017-10-20 DIAGNOSIS — L578 Other skin changes due to chronic exposure to nonionizing radiation: Secondary | ICD-10-CM | POA: Diagnosis not present

## 2017-11-16 ENCOUNTER — Ambulatory Visit (INDEPENDENT_AMBULATORY_CARE_PROVIDER_SITE_OTHER): Payer: Medicare Other | Admitting: Sports Medicine

## 2017-11-16 ENCOUNTER — Encounter: Payer: Self-pay | Admitting: Sports Medicine

## 2017-11-16 DIAGNOSIS — M7712 Lateral epicondylitis, left elbow: Secondary | ICD-10-CM

## 2017-11-16 DIAGNOSIS — M216X2 Other acquired deformities of left foot: Secondary | ICD-10-CM

## 2017-11-16 DIAGNOSIS — M216X1 Other acquired deformities of right foot: Secondary | ICD-10-CM | POA: Diagnosis not present

## 2017-11-16 DIAGNOSIS — M25551 Pain in right hip: Secondary | ICD-10-CM

## 2017-11-16 NOTE — Assessment & Plan Note (Signed)
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

## 2017-11-16 NOTE — Assessment & Plan Note (Signed)
-   suspect due to underlying hip OA with significant IR limitation on R - only bothersome at night with lateral hip pain - recommend placing a pillow to prevent you from rolling on that side - if develop groin pain or pain with putting on shoes, then will order X-ray - NSAIDs as needed

## 2017-11-16 NOTE — Assessment & Plan Note (Signed)
Patient was fitted for a : standard, cushioned, semi-rigid orthotic. The orthotic was heated and afterward the patient stood on the orthotic blank positioned on the orthotic stand. The patient was positioned in subtalar neutral position and 10 degrees of ankle dorsiflexion in a weight bearing stance. After completion of molding, a stable base was applied to the orthotic blank. The blank was ground to a stable position for weight bearing. Size: 11 Base: Blue, EVA Posting: none Additional orthotic padding: none  Total time spent with the patient was 25 minutes with greater than 50% of the time spent in face-to-face consultation doing gait analysis, construction, and customization of custom orthotics.

## 2017-11-16 NOTE — Progress Notes (Signed)
Stephen Gaines - 67 y.o. male MRN [id number]  Date of birth: 11-15-1950   Chief complaint: R hip pain, new orthotics d/t foot pain, L elbow pain  SUBJECTIVE:    History of present illness: Stephen Gaines is a 67 year old male who presents to the clinic today with a chief complaint of bilateral foot pain in the midfoot.  He is also having chronic plantar fasciitis bilaterally.  He was made custom orthotics approximately a year and a half ago with improvement in his symptoms.  He reports today for a new set of orthotics to be customized.  Denies any numbness or tingling of the feet.  His foot pain is worse when he walks barefoot and worse first thing in the morning.  He has medial arch pain with a history of Pes cavus feet bilaterally.  He also has a history of a traumatic ankle injury on the left with a persistent deformity in which the orthotics have eliminated his ankle pain.  He also has developed right-sided lateral hip pain.  His symptoms have been present for approximately 1 month and have persisted.  He describes his pain as sharp and occasionally wakes him up in the middle of the night when he rolls on his right hip.  He has never had anything like this before.  It is nonradicular in nature.  It is not significantly tender to palpation.  He denies any significant weakness of the hip.  He has never been told he has arthritis.  He does not have trouble putting his shoes on and he denies groin pain.  No significant knee pain on the right side.  No low back pain.  Furthermore, he is having left-sided lateral elbow pain.  His symptoms have been present for approximately 3 weeks and have persisted.  His pain is described as sharp and worse with things like gripping and wrist extension exercises.  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  He does not use power tools, hammers or plays tennis.  He does do a lot of cooking which causes a lot of wrist extension exercises.  In the past he has used a  tennis elbow strap which was bothersome to him so he had stopped using it.  He does intermittently take anti-inflammatories which does help with his symptoms.  Denies any numbness or tingling of the extremity.  Denies any significant weakness although painful.   Review of systems:  As stated above   Interval past medical history, surgical history, family history, and social history obtained and are unchanged.   Medications reviewed and unchanged. Allergies reviewed and unchanged.  OBJECTIVE:  Physical exam: Vital signs are reviewed. BP 140/62   Ht 6\' 1"  (1.854 m)   Wt 175 lb (79.4 kg)   BMI 23.09 kg/m   Gen.: Alert, oriented, appears stated age, in no apparent distress Integumentary: No rashes or ecchymoses Neurologic: nonfocal Gait: No associated limp, slight external rotation of his right foot with ambulation Psych: Normal affect, mood is described as good Musculoskeletal: Inspection of the left upper extremity demonstrates mild swelling at the level of the lateral epicondyle.  He has tenderness to palpation over the lateral epicondyle and tenderness with grip strength.  He has limited range of motion in wrist flexion secondary to Kienboch's disease.  Full wrist extension although uncomfortable.  Strength testing is limited secondary to his pain.  His elbow is stable to varus valgus stress.  Inspection of the right hip demonstrates no acute abnormality.  He  has no tenderness palpation of his greater trochanter.  He has significant restriction in range of motion and internal rotation as well as flexion and internal rotation.  Strength testing is 5 out of 5 in hip abduction, flexion, and extension.  Negative Fadir testing.  Negative Faber test.  Discomfort with logroll testing.  Neurovascularly intact.  Inspection of the bilateral feet demonstrate Pez cavus feet with tenderness in the medial longitudinal arch bilaterally.  He also has a deformity of his left ankle with persistent pronation and  forefoot valgus deformity.  Limited range of motion in dorsiflexion and plantarflexion on the left ankle.  Full range on the right ankle.  Strength testing is 5 out of 5.  Negative anterior drawers.  The ankles are stable to ligamentous testing.  He is neurovascular intact.    ASSESSMENT & PLAN: Acquired bilateral pes cavus Patient was fitted for a : standard, cushioned, semi-rigid orthotic. The orthotic was heated and afterward the patient stood on the orthotic blank positioned on the orthotic stand. The patient was positioned in subtalar neutral position and 10 degrees of ankle dorsiflexion in a weight bearing stance. After completion of molding, a stable base was applied to the orthotic blank. The blank was ground to a stable position for weight bearing. Size: 11 Base: Blue, EVA Posting: none Additional orthotic padding: none  Total time spent with the patient was 25 minutes with greater than 50% of the time spent in face-to-face consultation doing gait analysis, construction, and customization of custom orthotics.   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 - also gave supination/pronation exercises - patient has air cast splint at home, proper location demonstrated today - may take NSAIDs as needed for pain and discomfort - f/u in 6 weeks  Right hip pain - suspect due to underlying hip OA with significant IR limitation on R - only bothersome at night with lateral hip pain - recommend placing a pillow to prevent you from rolling on that side - if develop groin pain or pain with putting on shoes, then will order X-ray - NSAIDs as needed   Clydene Laming, [place]  I observed and examined the patient with the [facility] fellow and agree with assessment and plan.  Note reviewed and modified by me. Stefanie Libel, md

## 2017-12-08 DIAGNOSIS — Z23 Encounter for immunization: Secondary | ICD-10-CM | POA: Diagnosis not present

## 2018-01-24 ENCOUNTER — Other Ambulatory Visit: Payer: Self-pay | Admitting: Physician Assistant

## 2018-01-24 DIAGNOSIS — K5730 Diverticulosis of large intestine without perforation or abscess without bleeding: Secondary | ICD-10-CM | POA: Diagnosis not present

## 2018-01-24 DIAGNOSIS — R1032 Left lower quadrant pain: Secondary | ICD-10-CM | POA: Diagnosis not present

## 2018-01-26 ENCOUNTER — Ambulatory Visit
Admission: RE | Admit: 2018-01-26 | Discharge: 2018-01-26 | Disposition: A | Discharge status: Home / Self Care | Payer: Medicare Other | Source: Ambulatory Visit | Attending: Physician Assistant | Admitting: Physician Assistant

## 2018-01-26 DIAGNOSIS — R1032 Left lower quadrant pain: Secondary | ICD-10-CM

## 2018-01-26 DIAGNOSIS — K5730 Diverticulosis of large intestine without perforation or abscess without bleeding: Secondary | ICD-10-CM | POA: Diagnosis not present

## 2018-01-26 MED ORDER — IOPAMIDOL (ISOVUE-300) INJECTION 61%
100.0000 mL | Freq: Once | INTRAVENOUS | Status: AC | PRN
  Administered 2018-01-26: 100 mL via INTRAVENOUS

## 2018-01-31 ENCOUNTER — Other Ambulatory Visit: Payer: Medicare Other

## 2018-04-24 DIAGNOSIS — Z125 Encounter for screening for malignant neoplasm of prostate: Secondary | ICD-10-CM | POA: Diagnosis not present

## 2018-04-24 DIAGNOSIS — G4700 Insomnia, unspecified: Secondary | ICD-10-CM | POA: Diagnosis not present

## 2018-04-24 DIAGNOSIS — L299 Pruritus, unspecified: Secondary | ICD-10-CM | POA: Diagnosis not present

## 2018-04-24 DIAGNOSIS — I700 Atherosclerosis of aorta: Secondary | ICD-10-CM | POA: Diagnosis not present

## 2018-04-24 DIAGNOSIS — M542 Cervicalgia: Secondary | ICD-10-CM | POA: Diagnosis not present

## 2018-04-24 DIAGNOSIS — Z0000 Encounter for general adult medical examination without abnormal findings: Secondary | ICD-10-CM | POA: Diagnosis not present

## 2018-04-24 DIAGNOSIS — K5730 Diverticulosis of large intestine without perforation or abscess without bleeding: Secondary | ICD-10-CM | POA: Diagnosis not present

## 2018-04-24 DIAGNOSIS — R7301 Impaired fasting glucose: Secondary | ICD-10-CM | POA: Diagnosis not present

## 2018-04-24 DIAGNOSIS — Z23 Encounter for immunization: Secondary | ICD-10-CM | POA: Diagnosis not present

## 2018-04-24 DIAGNOSIS — E7800 Pure hypercholesterolemia, unspecified: Secondary | ICD-10-CM | POA: Diagnosis not present

## 2018-04-25 ENCOUNTER — Ambulatory Visit (INDEPENDENT_AMBULATORY_CARE_PROVIDER_SITE_OTHER)
Admission: RE | Admit: 2018-04-25 | Discharge: 2018-04-25 | Disposition: A | Discharge status: Home / Self Care | Payer: Medicare Other | Source: Ambulatory Visit | Attending: Acute Care | Admitting: Acute Care

## 2018-04-25 DIAGNOSIS — Z87891 Personal history of nicotine dependence: Secondary | ICD-10-CM | POA: Diagnosis not present

## 2018-04-25 DIAGNOSIS — Z122 Encounter for screening for malignant neoplasm of respiratory organs: Secondary | ICD-10-CM

## 2018-05-02 ENCOUNTER — Telehealth: Payer: Self-pay | Admitting: Acute Care

## 2018-05-02 DIAGNOSIS — Z122 Encounter for screening for malignant neoplasm of respiratory organs: Secondary | ICD-10-CM

## 2018-05-02 DIAGNOSIS — Z87891 Personal history of nicotine dependence: Secondary | ICD-10-CM

## 2018-05-02 NOTE — Telephone Encounter (Signed)
Pt informed of CT results per Sarah Groce, NP.  PT verbalized understanding.  Copy sent to PCP.  Order placed for 1 yr f/u CT.  

## 2018-05-07 DIAGNOSIS — M542 Cervicalgia: Secondary | ICD-10-CM | POA: Diagnosis not present

## 2018-05-15 DIAGNOSIS — M542 Cervicalgia: Secondary | ICD-10-CM | POA: Diagnosis not present

## 2018-05-21 DIAGNOSIS — M542 Cervicalgia: Secondary | ICD-10-CM | POA: Diagnosis not present

## 2018-09-10 DIAGNOSIS — Z20828 Contact with and (suspected) exposure to other viral communicable diseases: Secondary | ICD-10-CM | POA: Diagnosis not present

## 2018-10-05 DIAGNOSIS — D122 Benign neoplasm of ascending colon: Secondary | ICD-10-CM | POA: Diagnosis not present

## 2018-10-05 DIAGNOSIS — K635 Polyp of colon: Secondary | ICD-10-CM | POA: Diagnosis not present

## 2018-10-05 DIAGNOSIS — K5730 Diverticulosis of large intestine without perforation or abscess without bleeding: Secondary | ICD-10-CM | POA: Diagnosis not present

## 2018-10-05 DIAGNOSIS — Z86010 Personal history of colonic polyps: Secondary | ICD-10-CM | POA: Diagnosis not present

## 2018-10-05 DIAGNOSIS — D123 Benign neoplasm of transverse colon: Secondary | ICD-10-CM | POA: Diagnosis not present

## 2018-10-09 DIAGNOSIS — D123 Benign neoplasm of transverse colon: Secondary | ICD-10-CM | POA: Diagnosis not present

## 2018-10-09 DIAGNOSIS — D122 Benign neoplasm of ascending colon: Secondary | ICD-10-CM | POA: Diagnosis not present

## 2018-10-09 DIAGNOSIS — K635 Polyp of colon: Secondary | ICD-10-CM | POA: Diagnosis not present

## 2018-10-15 DIAGNOSIS — H04123 Dry eye syndrome of bilateral lacrimal glands: Secondary | ICD-10-CM | POA: Diagnosis not present

## 2018-10-15 DIAGNOSIS — H2513 Age-related nuclear cataract, bilateral: Secondary | ICD-10-CM | POA: Diagnosis not present

## 2018-10-22 DIAGNOSIS — L821 Other seborrheic keratosis: Secondary | ICD-10-CM | POA: Diagnosis not present

## 2018-10-22 DIAGNOSIS — L819 Disorder of pigmentation, unspecified: Secondary | ICD-10-CM | POA: Diagnosis not present

## 2018-10-22 DIAGNOSIS — L84 Corns and callosities: Secondary | ICD-10-CM | POA: Diagnosis not present

## 2018-10-22 DIAGNOSIS — L570 Actinic keratosis: Secondary | ICD-10-CM | POA: Diagnosis not present

## 2018-10-22 DIAGNOSIS — D1801 Hemangioma of skin and subcutaneous tissue: Secondary | ICD-10-CM | POA: Diagnosis not present

## 2018-10-22 DIAGNOSIS — D229 Melanocytic nevi, unspecified: Secondary | ICD-10-CM | POA: Diagnosis not present

## 2018-10-22 DIAGNOSIS — L812 Freckles: Secondary | ICD-10-CM | POA: Diagnosis not present

## 2018-10-22 DIAGNOSIS — L814 Other melanin hyperpigmentation: Secondary | ICD-10-CM | POA: Diagnosis not present

## 2018-11-16 DIAGNOSIS — Z20828 Contact with and (suspected) exposure to other viral communicable diseases: Secondary | ICD-10-CM | POA: Diagnosis not present

## 2018-11-17 DIAGNOSIS — Z23 Encounter for immunization: Secondary | ICD-10-CM | POA: Diagnosis not present

## 2019-04-15 ENCOUNTER — Telehealth: Payer: Self-pay | Admitting: Acute Care

## 2019-04-15 NOTE — Telephone Encounter (Signed)
Stephen Gaines, can you contact pt to schedule yearly low dose CT?

## 2019-04-15 NOTE — Telephone Encounter (Signed)
I have spoken with Stephen Gaines and his LCS Ct has been scheduled on 05/02/2019 @ 10:10am at [place] location

## 2019-04-26 ENCOUNTER — Other Ambulatory Visit: Payer: Self-pay | Admitting: Family Medicine

## 2019-04-26 ENCOUNTER — Ambulatory Visit
Admission: RE | Admit: 2019-04-26 | Discharge: 2019-04-26 | Disposition: A | Discharge status: Home / Self Care | Payer: Medicare Other | Source: Ambulatory Visit | Attending: Family Medicine | Admitting: Family Medicine

## 2019-04-26 DIAGNOSIS — L570 Actinic keratosis: Secondary | ICD-10-CM | POA: Diagnosis not present

## 2019-04-26 DIAGNOSIS — J449 Chronic obstructive pulmonary disease, unspecified: Secondary | ICD-10-CM | POA: Diagnosis not present

## 2019-04-26 DIAGNOSIS — Z0000 Encounter for general adult medical examination without abnormal findings: Secondary | ICD-10-CM | POA: Diagnosis not present

## 2019-04-26 DIAGNOSIS — E7800 Pure hypercholesterolemia, unspecified: Secondary | ICD-10-CM | POA: Diagnosis not present

## 2019-04-26 DIAGNOSIS — R198 Other specified symptoms and signs involving the digestive system and abdomen: Secondary | ICD-10-CM | POA: Diagnosis not present

## 2019-04-26 DIAGNOSIS — I700 Atherosclerosis of aorta: Secondary | ICD-10-CM | POA: Diagnosis not present

## 2019-04-26 DIAGNOSIS — R0989 Other specified symptoms and signs involving the circulatory and respiratory systems: Secondary | ICD-10-CM | POA: Diagnosis not present

## 2019-04-26 DIAGNOSIS — R0689 Other abnormalities of breathing: Secondary | ICD-10-CM

## 2019-04-26 DIAGNOSIS — R7309 Other abnormal glucose: Secondary | ICD-10-CM | POA: Diagnosis not present

## 2019-04-26 DIAGNOSIS — K5730 Diverticulosis of large intestine without perforation or abscess without bleeding: Secondary | ICD-10-CM | POA: Diagnosis not present

## 2019-04-26 DIAGNOSIS — Z125 Encounter for screening for malignant neoplasm of prostate: Secondary | ICD-10-CM | POA: Diagnosis not present

## 2019-05-02 ENCOUNTER — Ambulatory Visit
Admission: RE | Admit: 2019-05-02 | Discharge: 2019-05-02 | Disposition: A | Discharge status: Home / Self Care | Payer: Medicare Other | Source: Ambulatory Visit | Attending: Acute Care | Admitting: Acute Care

## 2019-05-02 ENCOUNTER — Other Ambulatory Visit: Payer: Self-pay

## 2019-05-02 DIAGNOSIS — Z87891 Personal history of nicotine dependence: Secondary | ICD-10-CM

## 2019-05-02 DIAGNOSIS — Z122 Encounter for screening for malignant neoplasm of respiratory organs: Secondary | ICD-10-CM

## 2019-05-09 NOTE — Progress Notes (Signed)
Please call patient and let them  know their  low dose Ct was read as a Lung RADS 2: nodules that are benign in appearance and behavior with a very low likelihood of becoming a clinically active cancer due to size or lack of growth. Recommendation per radiology is for a repeat LDCT in 12 months. .Please let them  know we will order and schedule their  annual screening scan for 04/2020. Please let them  know there was notation of CAD on their  scan.  Please remind the patient  that this is a non-gated exam therefore degree or severity of disease  cannot be determined. Please have them  follow up with their PCP regarding potential risk factor modification, dietary therapy or pharmacologic therapy if clinically indicated. Pt.  is not currently on statin therapy. Please place order for annual  screening scan for  04/2020 and fax results to PCP. Thanks so much.

## 2019-05-10 ENCOUNTER — Other Ambulatory Visit: Payer: Self-pay | Admitting: *Deleted

## 2019-05-10 DIAGNOSIS — Z87891 Personal history of nicotine dependence: Secondary | ICD-10-CM

## 2019-06-06 ENCOUNTER — Encounter: Payer: Self-pay | Admitting: Sports Medicine

## 2019-06-06 ENCOUNTER — Ambulatory Visit (INDEPENDENT_AMBULATORY_CARE_PROVIDER_SITE_OTHER): Payer: Medicare Other | Admitting: Sports Medicine

## 2019-06-06 ENCOUNTER — Ambulatory Visit
Admission: RE | Admit: 2019-06-06 | Discharge: 2019-06-06 | Disposition: A | Discharge status: Home / Self Care | Payer: Medicare Other | Source: Ambulatory Visit | Attending: Sports Medicine | Admitting: Sports Medicine

## 2019-06-06 ENCOUNTER — Other Ambulatory Visit: Payer: Self-pay

## 2019-06-06 VITALS — BP 142/82 | Ht 73.0 in | Wt 173.0 lb

## 2019-06-06 DIAGNOSIS — M25551 Pain in right hip: Secondary | ICD-10-CM

## 2019-06-06 DIAGNOSIS — M1611 Unilateral primary osteoarthritis, right hip: Secondary | ICD-10-CM | POA: Diagnosis not present

## 2019-06-06 MED ORDER — MELOXICAM 15 MG PO TABS: 15.0000 mg | ORAL_TABLET | Freq: Every day | ORAL | 0 refills | Status: DC

## 2019-06-06 NOTE — Progress Notes (Signed)
PCP: Mayra Neer, MD  Subjective:   HPI: Patient is a 69 y.o. male here for evaluation of right hip pain.  Patient notes the pain mostly occurs at night.  Is located in his groin.  Occasionally he will get radiation down the anterior aspect of his leg.  Patient denies any low back pain.  He has no posterior hip pain or posterior leg pain.  Patient denies any numbness or tingling.  Of note patient was seen in 2018 for orthotics.  At that time he was noted to have relatively significant internal rotation deficit of his right hip.  The time it was thought he was developing early arthritis.  Patient notes he remains very active and rarely gets pain during the day or with activity.  Patient denies bruising or swelling.  He denies any numbness or tingling.  He does have a history of cavus foot on the left side. Trauma from remote calcaneal fracture (fall off ladder)   Review of Systems:  Foot pain daily on left/  This overshadows hip pain/  Orthotics have really helped since we made them in 2018 See HPI above.  History reviewed. No pertinent past medical history.  Current Outpatient Medications on File Prior to Visit  Medication Sig Dispense Refill  . cyclobenzaprine (FLEXERIL) 10 MG tablet     . dapsone 100 MG tablet     . meloxicam (MOBIC) 15 MG tablet Take 1 tablet (15 mg total) by mouth daily. (Patient not taking: Reported on 06/06/2019) 30 tablet 3  . methylPREDNISolone (MEDROL DOSEPAK) 4 MG TBPK tablet 6 day dose pack - take as directed (Patient not taking: Reported on 06/06/2019) 21 tablet 0  . triamcinolone cream (KENALOG) 0.5 %      No current facility-administered medications on file prior to visit.    History reviewed. No pertinent surgical history.  Allergies  Allergen Reactions  . Codeine Anaphylaxis  . Sulfamethoxazole Anaphylaxis  . Sulfa Antibiotics Nausea Only    Social History   Socioeconomic History  . Marital status: Married    Spouse name: Not on file  . Number  of children: Not on file  . Years of education: Not on file  . Highest education level: Not on file  Occupational History  . Not on file  Tobacco Use  . Smoking status: Former Smoker    Packs/day: 1.50    Years: 45.00    Pack years: 67.50    Types: Cigarettes    Quit date: 03/07/2005    Years since quitting: 14.2  . Smokeless tobacco: Never Used  . Tobacco comment: Encouraged to remain smoke free  Substance and Sexual Activity  . Alcohol use: Not on file  . Drug use: Not on file  . Sexual activity: Not on file  Other Topics Concern  . Not on file  Social History Narrative  . Not on file   Social Determinants of Health   Financial Resource Strain:   . Difficulty of Paying Living Expenses:   Food Insecurity:   . Worried About Charity fundraiser in the Last Year:   . Arboriculturist in the Last Year:   Transportation Needs:   . Film/video editor (Medical):   Marland Kitchen Lack of Transportation (Non-Medical):   Physical Activity:   . Days of Exercise per Week:   . Minutes of Exercise per Session:   Stress:   . Feeling of Stress :   Social Connections:   . Frequency of Communication with Friends  and Family:   . Frequency of Social Gatherings with Friends and Family:   . Attends Religious Services:   . Active Member of Clubs or Organizations:   . Attends Archivist Meetings:   Marland Kitchen Marital Status:   Intimate Partner Violence:   . Fear of Current or Ex-Partner:   . Emotionally Abused:   Marland Kitchen Physically Abused:   . Sexually Abused:     History reviewed. No pertinent family history.      Objective:  Physical Exam: BP (!) 142/82   Ht 6\' 1"  (1.854 m)   Wt 173 lb (78.5 kg)   BMI 22.82 kg/m  Gen: NAD, comfortable in exam room Lungs: Breathing comfortably on room air Hip Exam Right -Inspection: No deformity, no discoloration -Palpation: SI joint: non-tender; greater trochanter: non-tender -ROM: Patient unable to internally rotate his hip with the hip flexed at 90  degrees.  60 degrees of external rotation with hip flexed at 90 degrees.  Normal range of motion with hip flexion and abduction -Strength: Flexion: 5/5; Extension 5/5; Abduction: 5/5 -No leg length discrepancy -Negative logroll test -Negative Faber, negative Fadir -Limb neurovascularly intact     Assessment & Plan:  Patient is a 69 y.o. male here for evaluation of right hip pain  1.  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  After that he will take it as needed  Patient will follow up to have orthotics made in the future as his old ones are starting to wear out  Review of XR: does have some spurring and loss of cartilage on inner aspect of hip joint.  Degree of DJD on XR mild compared to changes on exam.  I observed and examined the patient with Dr. Sheppard Coil and agree with assessment and plan.  Note reviewed and modified by me.  Ila Mcgill, MD

## 2019-06-06 NOTE — Patient Instructions (Signed)
The pain in your hip is likely caused by arthritis of the hip joint. -We will get x-rays of your hip to evaluate the degree of arthritis that you have -We will call you with results of the x-rays -We have sent in a prescription for meloxicam.  Take this daily for 2 weeks.  After that you may take it as needed -We will work on Government social research officer for orthotics  We will have you follow-up to have new orthotics made once we get insurance approval for these

## 2019-06-18 ENCOUNTER — Other Ambulatory Visit: Payer: Self-pay

## 2019-06-18 ENCOUNTER — Ambulatory Visit (INDEPENDENT_AMBULATORY_CARE_PROVIDER_SITE_OTHER): Payer: Medicare Other | Admitting: Sports Medicine

## 2019-06-18 VITALS — BP 132/70 | Ht 74.0 in | Wt 175.0 lb

## 2019-06-18 DIAGNOSIS — M216X1 Other acquired deformities of right foot: Secondary | ICD-10-CM

## 2019-06-18 DIAGNOSIS — M216X2 Other acquired deformities of left foot: Secondary | ICD-10-CM | POA: Diagnosis not present

## 2019-06-18 NOTE — Progress Notes (Signed)
HPI: Patient is here today to be fitted for standard orthotic. Pt is being treated for bilateral acquired pes planus with history of a left calcaneal fracture  Patient was fitted for a standard, cushioned, semi-rigid orthotic.  The orthotic was heated and the patient stood on the orthotic blank positioned on the orthotic stand. The patient was positioned in subtalar neutral position and 10 degrees of ankle dorsiflexion in a weight bearing stance. After molding, a stable [employer] EVA base was applied to the orthotic blank.   The blank was ground to a stable position for weight bearing. Size: 10 base: Blue EVA posting: none additional orthotic padding: First pair of orthotics had heel lifts, we provided him with heel lifts and instructed him how to place these if he finds them necessary  Patient understood RTC needs and will follow up prn  Lanier Clam, DO, [facility] Fellow  Patient seen and evaluated with the sports medicine fellow.  I agree with the above plan of care.  Patient is here today for new custom orthotics.  Please see previous office notes for details regarding history and physical exam findings for his history of pes cavus and remote calcaneal fracture.  Of note, as mentioned above, we elected not to put new heel lifts on these orthotics but we did provide him with a pair to use at a later date if he prefers.  Follow-up as needed.

## 2019-06-29 ENCOUNTER — Other Ambulatory Visit: Payer: Self-pay | Admitting: Sports Medicine

## 2019-10-17 DIAGNOSIS — Z9889 Other specified postprocedural states: Secondary | ICD-10-CM | POA: Diagnosis not present

## 2019-10-17 DIAGNOSIS — H04123 Dry eye syndrome of bilateral lacrimal glands: Secondary | ICD-10-CM | POA: Diagnosis not present

## 2019-10-17 DIAGNOSIS — H2513 Age-related nuclear cataract, bilateral: Secondary | ICD-10-CM | POA: Diagnosis not present

## 2019-10-22 DIAGNOSIS — L821 Other seborrheic keratosis: Secondary | ICD-10-CM | POA: Diagnosis not present

## 2019-10-22 DIAGNOSIS — L812 Freckles: Secondary | ICD-10-CM | POA: Diagnosis not present

## 2019-10-22 DIAGNOSIS — D1801 Hemangioma of skin and subcutaneous tissue: Secondary | ICD-10-CM | POA: Diagnosis not present

## 2019-10-22 DIAGNOSIS — D229 Melanocytic nevi, unspecified: Secondary | ICD-10-CM | POA: Diagnosis not present

## 2019-10-22 DIAGNOSIS — L814 Other melanin hyperpigmentation: Secondary | ICD-10-CM | POA: Diagnosis not present

## 2019-10-22 DIAGNOSIS — L570 Actinic keratosis: Secondary | ICD-10-CM | POA: Diagnosis not present

## 2019-10-22 DIAGNOSIS — L819 Disorder of pigmentation, unspecified: Secondary | ICD-10-CM | POA: Diagnosis not present

## 2019-11-30 DIAGNOSIS — Z23 Encounter for immunization: Secondary | ICD-10-CM | POA: Diagnosis not present

## 2019-12-27 DIAGNOSIS — Z23 Encounter for immunization: Secondary | ICD-10-CM | POA: Diagnosis not present

## 2020-03-25 ENCOUNTER — Telehealth: Payer: Self-pay | Admitting: Acute Care

## 2020-03-25 NOTE — Telephone Encounter (Signed)
Patient questions answered, Next LDCT is planned in 04/2020.

## 2020-04-02 DIAGNOSIS — M65332 Trigger finger, left middle finger: Secondary | ICD-10-CM | POA: Diagnosis not present

## 2020-05-05 ENCOUNTER — Ambulatory Visit
Admission: RE | Admit: 2020-05-05 | Discharge: 2020-05-05 | Disposition: A | Discharge status: Home / Self Care | Payer: Medicare Other | Source: Ambulatory Visit | Attending: Acute Care | Admitting: Acute Care

## 2020-05-05 ENCOUNTER — Other Ambulatory Visit: Payer: Self-pay

## 2020-05-05 DIAGNOSIS — I2510 Atherosclerotic heart disease of native coronary artery without angina pectoris: Secondary | ICD-10-CM | POA: Diagnosis not present

## 2020-05-05 DIAGNOSIS — Z87891 Personal history of nicotine dependence: Secondary | ICD-10-CM

## 2020-05-05 DIAGNOSIS — J432 Centrilobular emphysema: Secondary | ICD-10-CM | POA: Diagnosis not present

## 2020-05-05 DIAGNOSIS — J984 Other disorders of lung: Secondary | ICD-10-CM | POA: Diagnosis not present

## 2020-05-07 DIAGNOSIS — M47812 Spondylosis without myelopathy or radiculopathy, cervical region: Secondary | ICD-10-CM | POA: Diagnosis not present

## 2020-05-07 DIAGNOSIS — K5730 Diverticulosis of large intestine without perforation or abscess without bleeding: Secondary | ICD-10-CM | POA: Diagnosis not present

## 2020-05-07 DIAGNOSIS — R7301 Impaired fasting glucose: Secondary | ICD-10-CM | POA: Diagnosis not present

## 2020-05-07 DIAGNOSIS — Z0000 Encounter for general adult medical examination without abnormal findings: Secondary | ICD-10-CM | POA: Diagnosis not present

## 2020-05-07 DIAGNOSIS — I700 Atherosclerosis of aorta: Secondary | ICD-10-CM | POA: Diagnosis not present

## 2020-05-07 DIAGNOSIS — Z125 Encounter for screening for malignant neoplasm of prostate: Secondary | ICD-10-CM | POA: Diagnosis not present

## 2020-05-07 DIAGNOSIS — M169 Osteoarthritis of hip, unspecified: Secondary | ICD-10-CM | POA: Diagnosis not present

## 2020-05-07 DIAGNOSIS — J449 Chronic obstructive pulmonary disease, unspecified: Secondary | ICD-10-CM | POA: Diagnosis not present

## 2020-05-12 NOTE — Progress Notes (Signed)
Please call patient and let them  know their  low dose Ct was read as a Lung RADS 2: nodules that are benign in appearance and behavior with a very low likelihood of becoming a clinically active cancer due to size or lack of growth. Recommendation per radiology is for a repeat LDCT in 12 months. .Please let them  know we will order and schedule their  annual screening scan for 05/2021 Please let them  know there was notation of CAD on their  scan.  Please remind the patient  that this is a non-gated exam therefore degree or severity of disease  cannot be determined. Please have them  follow up with their PCP regarding potential risk factor modification, dietary therapy or pharmacologic therapy if clinically indicated. Pt.  is not  currently on statin therapy. Please place order for annual  screening scan for  05/2021 and fax results to PCP. Thanks so much.

## 2020-05-14 ENCOUNTER — Other Ambulatory Visit: Payer: Self-pay | Admitting: *Deleted

## 2020-05-14 DIAGNOSIS — Z87891 Personal history of nicotine dependence: Secondary | ICD-10-CM

## 2020-05-25 DIAGNOSIS — M25551 Pain in right hip: Secondary | ICD-10-CM | POA: Diagnosis not present

## 2020-07-23 DIAGNOSIS — Z23 Encounter for immunization: Secondary | ICD-10-CM | POA: Diagnosis not present

## 2020-09-11 IMAGING — CT CT CHEST LUNG CANCER SCREENING LOW DOSE W/O CM
1 series · 10 of 10 positions shown, 13 images · non-contrast
Comparison: 04/24/2017

CLINICAL DATA: 67-year-old male with 45 pack year history of
smoking. Lung cancer screening.

EXAM:
CT CHEST WITHOUT CONTRAST LOW-DOSE FOR LUNG CANCER SCREENING
TECHNIQUE: Multidetector CT imaging of the chest was performed following the
standard protocol without IV contrast.

[ct lung segmentation data · axial · 0.71mm/px · z∈[-268,-268]mm · 10 of 320 frames shown]
[frame 1/320  mediastinal]
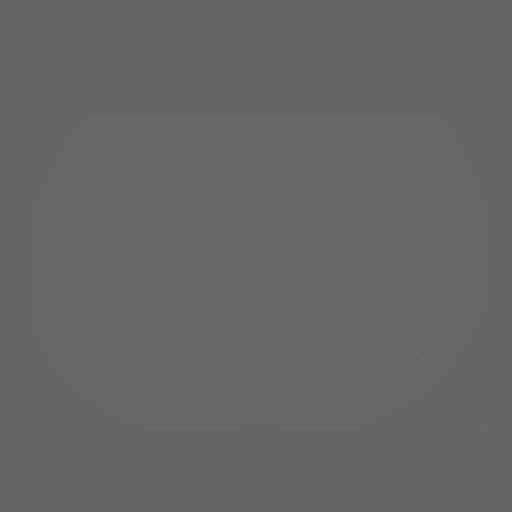
[frame 1/320  lung]
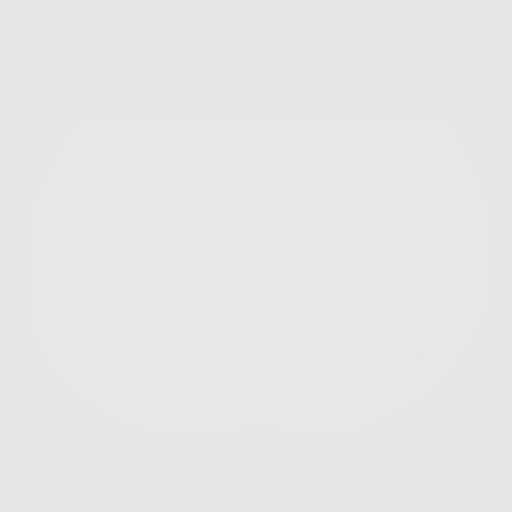
[frame 36/320  lung]
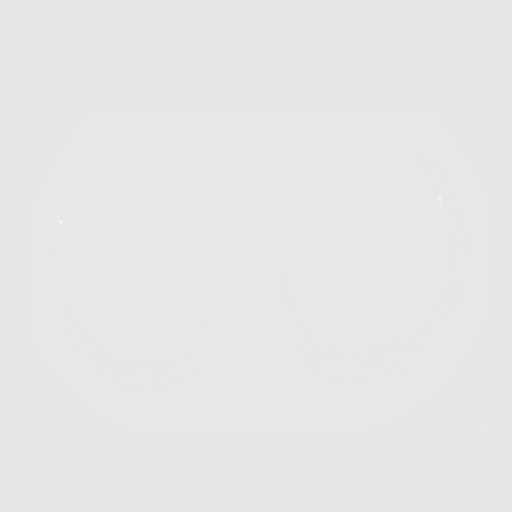
[frame 71/320  lung]
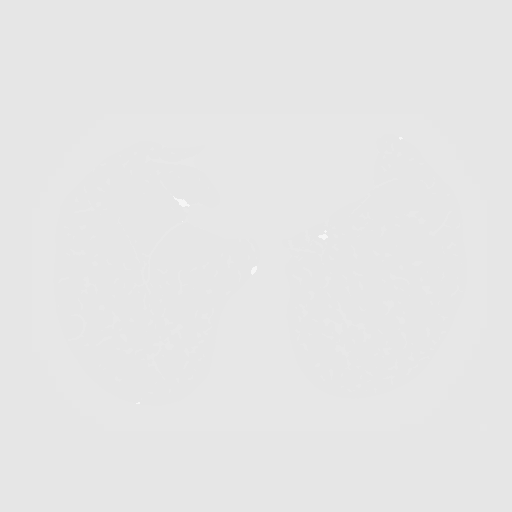
[frame 107/320  lung]
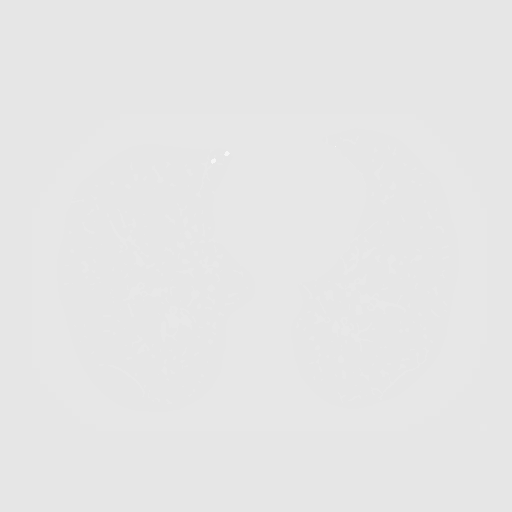
[frame 142/320  mediastinal]
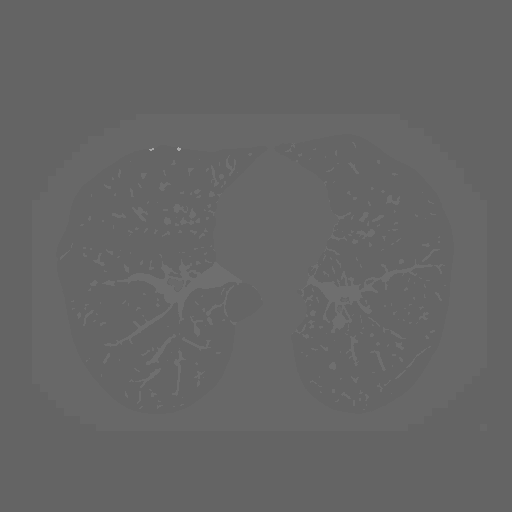
[frame 142/320  lung]
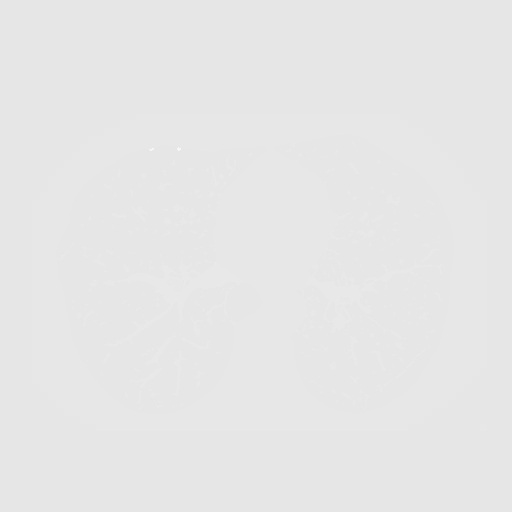
[frame 178/320  lung]
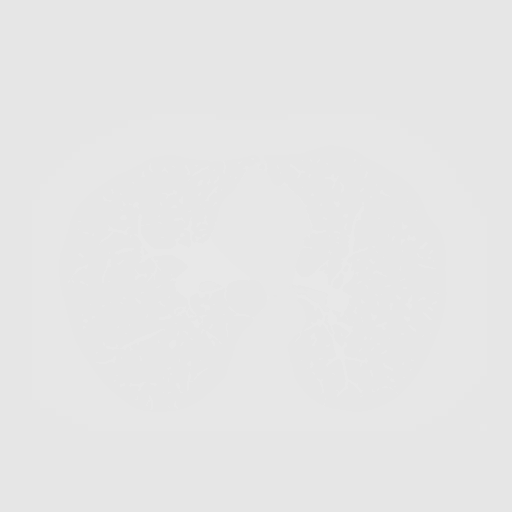
[frame 213/320  lung]
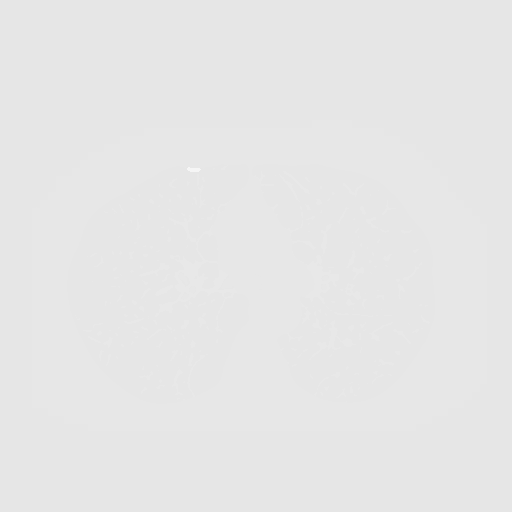
[frame 249/320  lung]
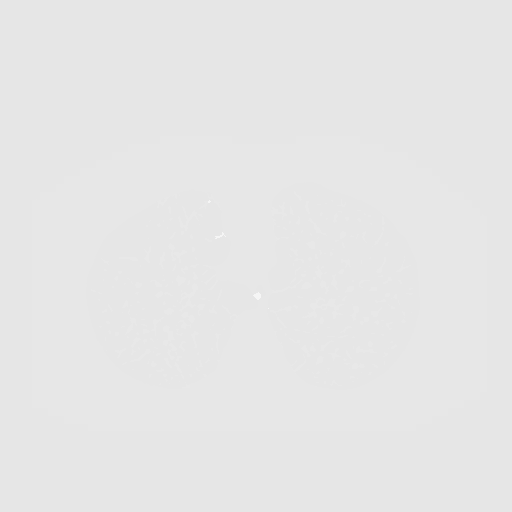
[frame 284/320  mediastinal]
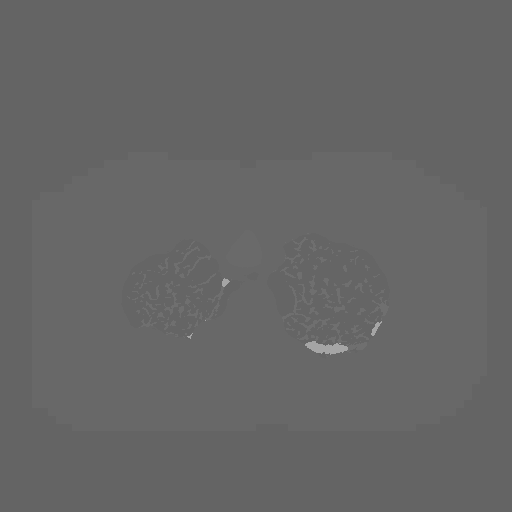
[frame 284/320  lung]
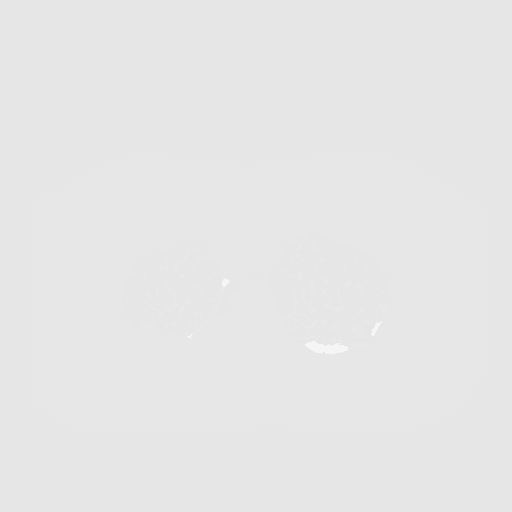
[frame 320/320  lung]
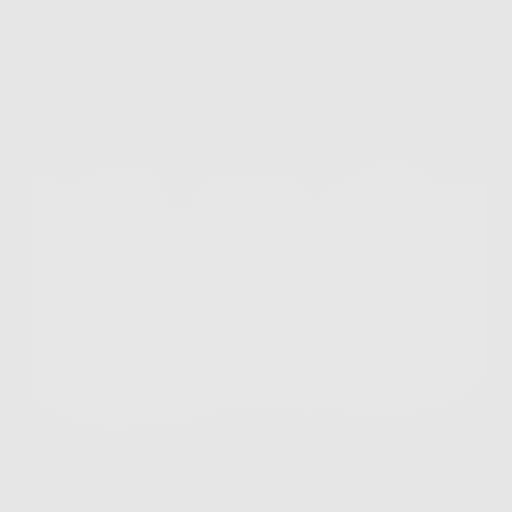

[10 of 10 positions shown; findings below may reference images not displayed]

FINDINGS: Cardiovascular: The heart size is normal. No substantial pericardial
effusion. Coronary artery calcification is evident. Atherosclerotic
calcification is noted in the wall of the thoracic aorta.

Mediastinum/Nodes: No mediastinal lymphadenopathy. No evidence for
gross hilar lymphadenopathy although assessment is limited by the
lack of intravenous contrast on today's study. The esophagus has
normal imaging features. There is no axillary lymphadenopathy.

Lungs/Pleura: The central tracheobronchial airways are patent.
Biapical pleuroparenchymal scarring again noted. Centrilobular and
paraseptal emphysema evident. Previously identified pulmonary
nodules are stable. No new suspicious nodule or mass. No focal
airspace consolidation. No pleural effusion.

Upper Abdomen: 15 mm low-density lesion in the dome of the liver is
stable. Otherwise unremarkable.

Musculoskeletal: No worrisome lytic or sclerotic osseous
abnormality.
IMPRESSION: 1. Lung-RADS 2, benign appearance or behavior. Continue annual
screening with low-dose chest CT without contrast in 12 months.
2.  Emphysema. (YLM3J-Y8H.X)
3.  Aortic Atherosclerois (YLM3J-170.0)

## 2020-09-15 DIAGNOSIS — M65332 Trigger finger, left middle finger: Secondary | ICD-10-CM | POA: Diagnosis not present

## 2020-10-15 DIAGNOSIS — L708 Other acne: Secondary | ICD-10-CM | POA: Diagnosis not present

## 2020-10-15 DIAGNOSIS — Z20822 Contact with and (suspected) exposure to covid-19: Secondary | ICD-10-CM | POA: Diagnosis not present

## 2020-10-15 DIAGNOSIS — L570 Actinic keratosis: Secondary | ICD-10-CM | POA: Diagnosis not present

## 2020-10-15 DIAGNOSIS — X32XXXA Exposure to sunlight, initial encounter: Secondary | ICD-10-CM | POA: Diagnosis not present

## 2020-10-22 DIAGNOSIS — H04123 Dry eye syndrome of bilateral lacrimal glands: Secondary | ICD-10-CM | POA: Diagnosis not present

## 2020-10-22 DIAGNOSIS — H2513 Age-related nuclear cataract, bilateral: Secondary | ICD-10-CM | POA: Diagnosis not present

## 2020-10-23 ENCOUNTER — Other Ambulatory Visit: Payer: Self-pay

## 2020-10-23 DIAGNOSIS — M720 Palmar fascial fibromatosis [Dupuytren]: Secondary | ICD-10-CM | POA: Diagnosis not present

## 2020-10-23 DIAGNOSIS — M65332 Trigger finger, left middle finger: Secondary | ICD-10-CM | POA: Diagnosis not present

## 2020-11-21 DIAGNOSIS — Z23 Encounter for immunization: Secondary | ICD-10-CM | POA: Diagnosis not present

## 2021-01-29 DIAGNOSIS — Z23 Encounter for immunization: Secondary | ICD-10-CM | POA: Diagnosis not present

## 2021-03-26 DIAGNOSIS — R059 Cough, unspecified: Secondary | ICD-10-CM | POA: Diagnosis not present

## 2021-03-26 DIAGNOSIS — J449 Chronic obstructive pulmonary disease, unspecified: Secondary | ICD-10-CM | POA: Diagnosis not present

## 2021-03-26 DIAGNOSIS — I700 Atherosclerosis of aorta: Secondary | ICD-10-CM | POA: Diagnosis not present

## 2021-05-18 DIAGNOSIS — M6588 Other synovitis and tenosynovitis, other site: Secondary | ICD-10-CM | POA: Diagnosis not present

## 2021-05-18 DIAGNOSIS — M79641 Pain in right hand: Secondary | ICD-10-CM | POA: Diagnosis not present

## 2021-05-31 DIAGNOSIS — Z125 Encounter for screening for malignant neoplasm of prostate: Secondary | ICD-10-CM | POA: Diagnosis not present

## 2021-05-31 DIAGNOSIS — J449 Chronic obstructive pulmonary disease, unspecified: Secondary | ICD-10-CM | POA: Diagnosis not present

## 2021-05-31 DIAGNOSIS — J309 Allergic rhinitis, unspecified: Secondary | ICD-10-CM | POA: Diagnosis not present

## 2021-05-31 DIAGNOSIS — K5730 Diverticulosis of large intestine without perforation or abscess without bleeding: Secondary | ICD-10-CM | POA: Diagnosis not present

## 2021-05-31 DIAGNOSIS — M47812 Spondylosis without myelopathy or radiculopathy, cervical region: Secondary | ICD-10-CM | POA: Diagnosis not present

## 2021-05-31 DIAGNOSIS — Z0000 Encounter for general adult medical examination without abnormal findings: Secondary | ICD-10-CM | POA: Diagnosis not present

## 2021-05-31 DIAGNOSIS — I700 Atherosclerosis of aorta: Secondary | ICD-10-CM | POA: Diagnosis not present

## 2021-05-31 DIAGNOSIS — Z1211 Encounter for screening for malignant neoplasm of colon: Secondary | ICD-10-CM | POA: Diagnosis not present

## 2021-05-31 DIAGNOSIS — R7301 Impaired fasting glucose: Secondary | ICD-10-CM | POA: Diagnosis not present

## 2021-05-31 DIAGNOSIS — Z87891 Personal history of nicotine dependence: Secondary | ICD-10-CM | POA: Diagnosis not present

## 2021-06-16 ENCOUNTER — Telehealth: Payer: Self-pay | Admitting: *Deleted

## 2021-06-16 NOTE — Telephone Encounter (Signed)
Left message for pt to call back regarding lung cancer screening f/u. Need to make sure how long it has been since pt quit smoking to see if he still qualifies.  ?

## 2021-06-17 NOTE — Telephone Encounter (Signed)
Spoke with patient who verified he had quit smoker 15 years ago. Advised he no longer meets lung cancer screening eligibility if over 15 years as non-smoker but PCP could recommend and order a CT chest wo contrast, if desired and agreed upon.  Patient is interested in continued Chest CT, as per PCP recommendation.  Patient will be removed from lung cancer screening program.  Results of 2022 LDCT routed to PCP with note explaining he would no longer be eligible for [facility] LDCT for 2023.   ?

## 2021-07-09 ENCOUNTER — Telehealth: Payer: Self-pay | Admitting: *Deleted

## 2021-07-09 NOTE — Telephone Encounter (Signed)
LMTC to schedule Yearly Lung CA CT Scan. 

## 2021-11-02 DIAGNOSIS — L821 Other seborrheic keratosis: Secondary | ICD-10-CM | POA: Diagnosis not present

## 2021-11-02 DIAGNOSIS — D225 Melanocytic nevi of trunk: Secondary | ICD-10-CM | POA: Diagnosis not present

## 2021-11-02 DIAGNOSIS — L814 Other melanin hyperpigmentation: Secondary | ICD-10-CM | POA: Diagnosis not present

## 2021-11-02 DIAGNOSIS — L570 Actinic keratosis: Secondary | ICD-10-CM | POA: Diagnosis not present

## 2021-11-26 DIAGNOSIS — Z86010 Personal history of colonic polyps: Secondary | ICD-10-CM | POA: Diagnosis not present

## 2021-11-26 DIAGNOSIS — K648 Other hemorrhoids: Secondary | ICD-10-CM | POA: Diagnosis not present

## 2021-11-26 DIAGNOSIS — D123 Benign neoplasm of transverse colon: Secondary | ICD-10-CM | POA: Diagnosis not present

## 2021-11-26 DIAGNOSIS — Z09 Encounter for follow-up examination after completed treatment for conditions other than malignant neoplasm: Secondary | ICD-10-CM | POA: Diagnosis not present

## 2021-11-26 DIAGNOSIS — K5730 Diverticulosis of large intestine without perforation or abscess without bleeding: Secondary | ICD-10-CM | POA: Diagnosis not present

## 2021-11-26 DIAGNOSIS — Z800 Family history of malignant neoplasm of digestive organs: Secondary | ICD-10-CM | POA: Diagnosis not present

## 2021-11-30 DIAGNOSIS — D123 Benign neoplasm of transverse colon: Secondary | ICD-10-CM | POA: Diagnosis not present

## 2021-12-04 DIAGNOSIS — Z23 Encounter for immunization: Secondary | ICD-10-CM | POA: Diagnosis not present

## 2021-12-06 DIAGNOSIS — Z23 Encounter for immunization: Secondary | ICD-10-CM | POA: Diagnosis not present

## 2021-12-27 DIAGNOSIS — H04123 Dry eye syndrome of bilateral lacrimal glands: Secondary | ICD-10-CM | POA: Diagnosis not present

## 2021-12-27 DIAGNOSIS — H2513 Age-related nuclear cataract, bilateral: Secondary | ICD-10-CM | POA: Diagnosis not present

## 2022-01-11 ENCOUNTER — Ambulatory Visit (INDEPENDENT_AMBULATORY_CARE_PROVIDER_SITE_OTHER): Payer: Medicare Other | Admitting: Sports Medicine

## 2022-01-11 VITALS — BP 139/62 | Ht 73.0 in | Wt 187.0 lb

## 2022-01-11 DIAGNOSIS — M216X2 Other acquired deformities of left foot: Secondary | ICD-10-CM | POA: Diagnosis not present

## 2022-01-11 DIAGNOSIS — M216X1 Other acquired deformities of right foot: Secondary | ICD-10-CM

## 2022-01-11 NOTE — Progress Notes (Signed)
   Established Patient Office Visit  Subjective   Patient ID: Stephen Gaines, male    DOB: 1950-05-18  Age: 71 y.o. MRN: [id number]  Requesting orthotics. Older pair wearing out.  Patient has an acquired LLI.  Fall about 34 years ago fractured calcaneus and ankle.  Since then less ankle motion.  More foot rotation. WE made orthotics in 2019.  These have allowed him to walk ad be more active with less foot pain.  ROS as listed above in HPI    Objective:     BP 139/62   Ht '6\' 1"'$  (1.854 m)   Wt 187 lb (84.8 kg)   BMI 24.67 kg/m   Physical Exam Vitals reviewed.  Constitutional:      General: He is not in acute distress.    Appearance: Normal appearance. He is normal weight. He is not ill-appearing, toxic-appearing or diaphoretic.  Pulmonary:     Effort: Pulmonary effort is normal.  Neurological:     Mental Status: He is alert.   Bilateral pes cavus.  Deformity of the left ankle with pronation.at subtalar joint and this leads to external rotation of the left foot.  Antalgic gait however he does ambulate with pronated feet left greater than right.  Hallux valgus bilaterally.  He has beginning to have hammertoe deformity second third and fourth toes bilaterally. There was some correction in his gait noted with orthotics  Left leg ~ 1 cm shorter     Assessment & Plan:   Problem List Items Addressed This Visit       Other   Acquired bilateral pes cavus - Primary    Patient is here today for new orthotics.  He has been using this for many years with good relief of his foot pain after calcaneal fracture as a child.  No orthotics were made for him today.  He ambulated in them with good relief.  He may return to clinic for any adjustments him to be made or for any new wedged heel lift with that when it wears out of multiple months.  He verbalized understanding.  Follow-up as needed      Patient was fitted for a : standard, cushioned, semi-rigid orthotic. The orthotic was heated  and afterward the patient stood on the orthotic base positioned on the orthotic stand. The patient was positioned in subtalar neutral position and 10 degrees of ankle dorsiflexion in a weight bearing stance. After completion of molding, a stable base was applied to the orthotic blank. The base was ground to a stable position for weight bearing. Size: 11 Base: Blue EVA Additional Posting and Padding:1/4in medial heel wedge placed under left orthotic The patient ambulated these, and they were very comfortable.  Return if symptoms worsen or fail to improve.    Elmore Guise, DO  I observed and examined the patient with the [facility] resident and agree with assessment and plan.  Note reviewed and modified by me. Ila Mcgill, MD

## 2022-01-11 NOTE — Assessment & Plan Note (Signed)
Patient is here today for new orthotics.  He has been using this for many years with good relief of his foot pain after calcaneal fracture as a child.  No orthotics were made for him today.  He ambulated in them with good relief.  He may return to clinic for any adjustments him to be made or for any new wedged heel lift with that when it wears out of multiple months.  He verbalized understanding.  Follow-up as needed

## 2022-02-10 DIAGNOSIS — M545 Low back pain, unspecified: Secondary | ICD-10-CM | POA: Diagnosis not present

## 2022-02-15 DIAGNOSIS — R202 Paresthesia of skin: Secondary | ICD-10-CM | POA: Diagnosis not present

## 2022-02-15 DIAGNOSIS — L720 Epidermal cyst: Secondary | ICD-10-CM | POA: Diagnosis not present

## 2022-02-15 DIAGNOSIS — L728 Other follicular cysts of the skin and subcutaneous tissue: Secondary | ICD-10-CM | POA: Diagnosis not present

## 2022-02-15 DIAGNOSIS — L578 Other skin changes due to chronic exposure to nonionizing radiation: Secondary | ICD-10-CM | POA: Diagnosis not present

## 2022-02-15 DIAGNOSIS — R233 Spontaneous ecchymoses: Secondary | ICD-10-CM | POA: Diagnosis not present

## 2022-03-14 DIAGNOSIS — H52209 Unspecified astigmatism, unspecified eye: Secondary | ICD-10-CM | POA: Diagnosis not present

## 2022-03-14 DIAGNOSIS — H5203 Hypermetropia, bilateral: Secondary | ICD-10-CM | POA: Diagnosis not present

## 2022-03-14 DIAGNOSIS — H524 Presbyopia: Secondary | ICD-10-CM | POA: Diagnosis not present

## 2022-04-05 DIAGNOSIS — M65341 Trigger finger, right ring finger: Secondary | ICD-10-CM | POA: Diagnosis not present

## 2022-06-09 DIAGNOSIS — J449 Chronic obstructive pulmonary disease, unspecified: Secondary | ICD-10-CM | POA: Diagnosis not present

## 2022-06-09 DIAGNOSIS — M47812 Spondylosis without myelopathy or radiculopathy, cervical region: Secondary | ICD-10-CM | POA: Diagnosis not present

## 2022-06-09 DIAGNOSIS — J309 Allergic rhinitis, unspecified: Secondary | ICD-10-CM | POA: Diagnosis not present

## 2022-06-09 DIAGNOSIS — Z125 Encounter for screening for malignant neoplasm of prostate: Secondary | ICD-10-CM | POA: Diagnosis not present

## 2022-06-09 DIAGNOSIS — Z0000 Encounter for general adult medical examination without abnormal findings: Secondary | ICD-10-CM | POA: Diagnosis not present

## 2022-06-09 DIAGNOSIS — R7301 Impaired fasting glucose: Secondary | ICD-10-CM | POA: Diagnosis not present

## 2022-06-09 DIAGNOSIS — I700 Atherosclerosis of aorta: Secondary | ICD-10-CM | POA: Diagnosis not present

## 2022-06-09 DIAGNOSIS — K5730 Diverticulosis of large intestine without perforation or abscess without bleeding: Secondary | ICD-10-CM | POA: Diagnosis not present

## 2022-06-10 ENCOUNTER — Other Ambulatory Visit: Payer: Self-pay | Admitting: Family Medicine

## 2022-06-10 DIAGNOSIS — R945 Abnormal results of liver function studies: Secondary | ICD-10-CM

## 2022-06-24 ENCOUNTER — Ambulatory Visit
Admission: RE | Admit: 2022-06-24 | Discharge: 2022-06-24 | Disposition: A | Discharge status: Home / Self Care | Payer: Medicare HMO | Source: Ambulatory Visit | Attending: Family Medicine | Admitting: Family Medicine

## 2022-06-24 DIAGNOSIS — R945 Abnormal results of liver function studies: Secondary | ICD-10-CM | POA: Diagnosis not present

## 2022-07-11 DIAGNOSIS — R945 Abnormal results of liver function studies: Secondary | ICD-10-CM | POA: Diagnosis not present

## 2022-08-16 DIAGNOSIS — M65322 Trigger finger, left index finger: Secondary | ICD-10-CM | POA: Diagnosis not present

## 2022-10-03 DIAGNOSIS — M545 Low back pain, unspecified: Secondary | ICD-10-CM | POA: Diagnosis not present

## 2022-10-03 DIAGNOSIS — R748 Abnormal levels of other serum enzymes: Secondary | ICD-10-CM | POA: Diagnosis not present

## 2022-10-12 DIAGNOSIS — L570 Actinic keratosis: Secondary | ICD-10-CM | POA: Diagnosis not present

## 2022-10-12 DIAGNOSIS — L510 Nonbullous erythema multiforme: Secondary | ICD-10-CM | POA: Diagnosis not present

## 2022-10-12 DIAGNOSIS — M310 Hypersensitivity angiitis: Secondary | ICD-10-CM | POA: Diagnosis not present

## 2022-10-12 DIAGNOSIS — L958 Other vasculitis limited to the skin: Secondary | ICD-10-CM | POA: Diagnosis not present

## 2022-10-12 DIAGNOSIS — L309 Dermatitis, unspecified: Secondary | ICD-10-CM | POA: Diagnosis not present

## 2022-10-12 DIAGNOSIS — L821 Other seborrheic keratosis: Secondary | ICD-10-CM | POA: Diagnosis not present

## 2022-10-12 DIAGNOSIS — L814 Other melanin hyperpigmentation: Secondary | ICD-10-CM | POA: Diagnosis not present

## 2022-10-12 DIAGNOSIS — D225 Melanocytic nevi of trunk: Secondary | ICD-10-CM | POA: Diagnosis not present

## 2022-10-17 ENCOUNTER — Other Ambulatory Visit: Payer: Self-pay | Admitting: Nurse Practitioner

## 2022-10-17 DIAGNOSIS — Z860101 Personal history of adenomatous and serrated colon polyps: Secondary | ICD-10-CM | POA: Insufficient documentation

## 2022-10-17 DIAGNOSIS — K838 Other specified diseases of biliary tract: Secondary | ICD-10-CM

## 2022-10-17 DIAGNOSIS — Z148 Genetic carrier of other disease: Secondary | ICD-10-CM | POA: Diagnosis not present

## 2022-10-17 DIAGNOSIS — M169 Osteoarthritis of hip, unspecified: Secondary | ICD-10-CM | POA: Insufficient documentation

## 2022-10-17 DIAGNOSIS — J449 Chronic obstructive pulmonary disease, unspecified: Secondary | ICD-10-CM | POA: Insufficient documentation

## 2022-10-17 DIAGNOSIS — K760 Fatty (change of) liver, not elsewhere classified: Secondary | ICD-10-CM | POA: Insufficient documentation

## 2022-10-17 DIAGNOSIS — R7401 Elevation of levels of liver transaminase levels: Secondary | ICD-10-CM | POA: Diagnosis not present

## 2022-10-17 DIAGNOSIS — I700 Atherosclerosis of aorta: Secondary | ICD-10-CM | POA: Insufficient documentation

## 2022-10-17 DIAGNOSIS — R5383 Other fatigue: Secondary | ICD-10-CM | POA: Diagnosis not present

## 2022-10-17 DIAGNOSIS — R7402 Elevation of levels of lactic acid dehydrogenase (LDH): Secondary | ICD-10-CM

## 2022-10-17 DIAGNOSIS — M310 Hypersensitivity angiitis: Secondary | ICD-10-CM | POA: Diagnosis not present

## 2022-10-17 DIAGNOSIS — L510 Nonbullous erythema multiforme: Secondary | ICD-10-CM | POA: Diagnosis not present

## 2022-10-17 DIAGNOSIS — K5790 Diverticulosis of intestine, part unspecified, without perforation or abscess without bleeding: Secondary | ICD-10-CM | POA: Insufficient documentation

## 2022-10-17 DIAGNOSIS — M47812 Spondylosis without myelopathy or radiculopathy, cervical region: Secondary | ICD-10-CM | POA: Insufficient documentation

## 2022-10-17 DIAGNOSIS — L309 Dermatitis, unspecified: Secondary | ICD-10-CM | POA: Diagnosis not present

## 2022-10-21 DIAGNOSIS — Z148 Genetic carrier of other disease: Secondary | ICD-10-CM | POA: Insufficient documentation

## 2022-10-26 DIAGNOSIS — Z4802 Encounter for removal of sutures: Secondary | ICD-10-CM | POA: Diagnosis not present

## 2022-11-19 DIAGNOSIS — Z23 Encounter for immunization: Secondary | ICD-10-CM | POA: Diagnosis not present

## 2022-11-28 ENCOUNTER — Encounter: Payer: Self-pay | Admitting: Nurse Practitioner

## 2022-11-29 ENCOUNTER — Ambulatory Visit
Admission: RE | Admit: 2022-11-29 | Discharge: 2022-11-29 | Disposition: A | Discharge status: Home / Self Care | Payer: Medicare HMO | Source: Ambulatory Visit | Attending: Nurse Practitioner | Admitting: Nurse Practitioner

## 2022-11-29 DIAGNOSIS — K760 Fatty (change of) liver, not elsewhere classified: Secondary | ICD-10-CM

## 2022-11-29 DIAGNOSIS — K7689 Other specified diseases of liver: Secondary | ICD-10-CM | POA: Diagnosis not present

## 2022-11-29 DIAGNOSIS — R7401 Elevation of levels of liver transaminase levels: Secondary | ICD-10-CM

## 2022-11-29 DIAGNOSIS — K838 Other specified diseases of biliary tract: Secondary | ICD-10-CM

## 2022-11-29 DIAGNOSIS — I700 Atherosclerosis of aorta: Secondary | ICD-10-CM | POA: Diagnosis not present

## 2022-11-29 MED ORDER — GADOPICLENOL 0.5 MMOL/ML IV SOLN
8.0000 mL | Freq: Once | INTRAVENOUS | Status: AC | PRN
  Administered 2022-11-29: 8 mL via INTRAVENOUS

## 2022-12-01 DIAGNOSIS — M6530 Trigger finger, unspecified finger: Secondary | ICD-10-CM | POA: Insufficient documentation

## 2022-12-01 DIAGNOSIS — M65322 Trigger finger, left index finger: Secondary | ICD-10-CM | POA: Diagnosis not present

## 2022-12-20 DIAGNOSIS — K760 Fatty (change of) liver, not elsewhere classified: Secondary | ICD-10-CM | POA: Diagnosis not present

## 2023-01-02 DIAGNOSIS — H2513 Age-related nuclear cataract, bilateral: Secondary | ICD-10-CM | POA: Diagnosis not present

## 2023-01-02 DIAGNOSIS — H04123 Dry eye syndrome of bilateral lacrimal glands: Secondary | ICD-10-CM | POA: Diagnosis not present

## 2023-01-17 DIAGNOSIS — K760 Fatty (change of) liver, not elsewhere classified: Secondary | ICD-10-CM | POA: Diagnosis not present

## 2023-01-17 DIAGNOSIS — I7381 Erythromelalgia: Secondary | ICD-10-CM | POA: Diagnosis not present

## 2023-01-17 DIAGNOSIS — M169 Osteoarthritis of hip, unspecified: Secondary | ICD-10-CM | POA: Diagnosis not present

## 2023-01-17 DIAGNOSIS — M25552 Pain in left hip: Secondary | ICD-10-CM | POA: Diagnosis not present

## 2023-04-06 DIAGNOSIS — M65341 Trigger finger, right ring finger: Secondary | ICD-10-CM | POA: Diagnosis not present

## 2023-04-06 DIAGNOSIS — M65322 Trigger finger, left index finger: Secondary | ICD-10-CM | POA: Diagnosis not present

## 2023-04-18 DIAGNOSIS — M79674 Pain in right toe(s): Secondary | ICD-10-CM | POA: Diagnosis not present

## 2023-04-18 DIAGNOSIS — I7381 Erythromelalgia: Secondary | ICD-10-CM | POA: Diagnosis not present

## 2023-04-20 ENCOUNTER — Other Ambulatory Visit: Payer: Self-pay | Admitting: Nurse Practitioner

## 2023-04-20 DIAGNOSIS — K7469 Other cirrhosis of liver: Secondary | ICD-10-CM

## 2023-04-20 DIAGNOSIS — R7401 Elevation of levels of liver transaminase levels: Secondary | ICD-10-CM | POA: Diagnosis not present

## 2023-04-20 DIAGNOSIS — E441 Mild protein-calorie malnutrition: Secondary | ICD-10-CM | POA: Insufficient documentation

## 2023-04-20 DIAGNOSIS — K760 Fatty (change of) liver, not elsewhere classified: Secondary | ICD-10-CM | POA: Diagnosis not present

## 2023-04-20 DIAGNOSIS — Z148 Genetic carrier of other disease: Secondary | ICD-10-CM | POA: Diagnosis not present

## 2023-04-20 DIAGNOSIS — I7381 Erythromelalgia: Secondary | ICD-10-CM | POA: Insufficient documentation

## 2023-04-21 DIAGNOSIS — K760 Fatty (change of) liver, not elsewhere classified: Secondary | ICD-10-CM | POA: Diagnosis not present

## 2023-04-21 DIAGNOSIS — R7401 Elevation of levels of liver transaminase levels: Secondary | ICD-10-CM | POA: Diagnosis not present

## 2023-04-21 DIAGNOSIS — K7469 Other cirrhosis of liver: Secondary | ICD-10-CM | POA: Diagnosis not present

## 2023-04-25 ENCOUNTER — Ambulatory Visit
Admission: RE | Admit: 2023-04-25 | Discharge: 2023-04-25 | Disposition: A | Discharge status: Home / Self Care | Payer: Medicare Other | Source: Ambulatory Visit | Attending: Nurse Practitioner

## 2023-04-25 DIAGNOSIS — K7469 Other cirrhosis of liver: Secondary | ICD-10-CM

## 2023-04-25 DIAGNOSIS — K7460 Unspecified cirrhosis of liver: Secondary | ICD-10-CM | POA: Diagnosis not present

## 2023-05-05 ENCOUNTER — Ambulatory Visit (INDEPENDENT_AMBULATORY_CARE_PROVIDER_SITE_OTHER): Payer: PRIVATE HEALTH INSURANCE | Admitting: Podiatry

## 2023-05-05 ENCOUNTER — Ambulatory Visit (INDEPENDENT_AMBULATORY_CARE_PROVIDER_SITE_OTHER): Payer: 59

## 2023-05-05 ENCOUNTER — Encounter: Payer: Self-pay | Admitting: Podiatry

## 2023-05-05 ENCOUNTER — Telehealth: Payer: Self-pay | Admitting: Podiatry

## 2023-05-05 DIAGNOSIS — M2042 Other hammer toe(s) (acquired), left foot: Secondary | ICD-10-CM

## 2023-05-05 DIAGNOSIS — M2041 Other hammer toe(s) (acquired), right foot: Secondary | ICD-10-CM

## 2023-05-05 DIAGNOSIS — L97522 Non-pressure chronic ulcer of other part of left foot with fat layer exposed: Secondary | ICD-10-CM | POA: Diagnosis not present

## 2023-05-05 MED ORDER — DOXYCYCLINE HYCLATE 100 MG PO TABS: 100.0000 mg | ORAL_TABLET | Freq: Two times a day (BID) | ORAL | 0 refills | Status: DC

## 2023-05-05 NOTE — Telephone Encounter (Signed)
 Patients preferred pharmacy is [facility] on [address]

## 2023-05-05 NOTE — Progress Notes (Signed)
       Chief Complaint  Patient presents with   Callouses    Pt is here due to what he believes is a corn on his 2nd left toe, states it has been there for a while was seen by his PCP for the issue was given a topical cream to put on it, states that has not really help at all.    HPI: 73 y.o. male presents today with hammer toe deformities on both feet most notably 2nd and 3rd digits. Patient developed painful lesions to the ends of the left 2nd and 3rd toes, with wound formation to the second toe. He has been treating it with topical cream from PCP.  History reviewed. No pertinent past medical history.  History reviewed. No pertinent surgical history.  Allergies  Allergen Reactions   Codeine Anaphylaxis   Sulfamethoxazole Anaphylaxis   Sulfa Antibiotics Nausea Only    ROS    Physical Exam: There were no vitals filed for this visit.  General: The patient is alert and oriented x3 in no acute distress.  Dermatology: Wound present left distal second toe, fat layer exposed, 1 cm in diameter. Sensate and painful on palpation. Redness present to the toe no. No drainage, mild swelling. No focal warmth increase.  Vascular: Palpable pedal pulses bilaterally. Capillary refill within normal limits.  No diffuse appreciable edema.    Neurological: Light touch sensation grossly intact bilateral feet.   Musculoskeletal Exam: Semirigid hammertoe contractures to lesser digits bilaterally with some DIPJ contracture and angulation to bilateral 2nd toes.  Radiographs: Left and right foot radiographs 3 views weightbearing 05/05/2023 Left foot: No acute fractures, Hammer toe contractures of 2nd and 3rd digits. Left 2nd toe contracture of DIPJ with lateral angulation. Sclerotic changes to the calcaneus with spurring present.  No signs of osteolysis or cortical erosion at second toe. Right foot: No acute fractures, Hammer toe contractures of 2nd and 3rd digits. Left 2nd toe contracture of DIPJ with  lateral angulation. Valgus drift of first toe. Calcaneal spurring present.  Assessment/Plan of Care: 1. Hammertoes of both feet   2. Skin ulcer of toe of left foot with fat layer exposed (HCC)      Meds ordered this encounter  Medications   doxycycline (VIBRA-TABS) 100 MG tablet    Sig: Take 1 tablet (100 mg total) by mouth 2 (two) times daily for 7 days.    Dispense:  14 tablet    Refill:  0   None  Discussed clinical findings with patient today.  Left second toe dressed with antibiotic bandage.  7-day course of doxycycline out of abundance of caution. Monitor for signs of worsening infection  Crest pads dispensed for left and right foot to offload the hammertoes. Follow up in 2 weeks, contact office if signs and symptoms of infection develop or worsen.  Savalas Monje L. Marchia Bond, AACFAS [facility]     [address] [place], [place] [address]                Office [phone]  Fax [phone]

## 2023-05-10 ENCOUNTER — Other Ambulatory Visit: Payer: Self-pay | Admitting: Podiatry

## 2023-05-10 DIAGNOSIS — Z23 Encounter for immunization: Secondary | ICD-10-CM | POA: Diagnosis not present

## 2023-05-10 DIAGNOSIS — L97522 Non-pressure chronic ulcer of other part of left foot with fat layer exposed: Secondary | ICD-10-CM

## 2023-05-10 MED ORDER — DOXYCYCLINE HYCLATE 100 MG PO TABS: 100.0000 mg | ORAL_TABLET | Freq: Two times a day (BID) | ORAL | 0 refills | Status: AC

## 2023-05-10 NOTE — Progress Notes (Unsigned)
 Doxycycline re-sending to patient's preferred pharmacy.

## 2023-05-19 ENCOUNTER — Ambulatory Visit: Payer: Medicare Other | Admitting: Podiatry

## 2023-05-19 ENCOUNTER — Encounter: Payer: Self-pay | Admitting: Podiatry

## 2023-05-19 DIAGNOSIS — M7732 Calcaneal spur, left foot: Secondary | ICD-10-CM

## 2023-05-19 DIAGNOSIS — M2041 Other hammer toe(s) (acquired), right foot: Secondary | ICD-10-CM

## 2023-05-19 DIAGNOSIS — M2042 Other hammer toe(s) (acquired), left foot: Secondary | ICD-10-CM | POA: Diagnosis not present

## 2023-05-19 NOTE — Progress Notes (Signed)
 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 Its burning some this morning"     HPI: 73 y.o. male presents today following up with hammer toe deformities on both feet most notably 2nd and 3rd digits.  They appear much less inflamed today and he is superficial wounds to the ends of the toes have healed.  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  Today he is also asking for advice of prominent heel spur to the back of left heel. Reports remote history of calcaneal fracture.  History reviewed. No pertinent past medical history.  History reviewed. No pertinent surgical history.  Allergies  Allergen Reactions   Codeine Anaphylaxis   Sulfamethoxazole Anaphylaxis   Other     Other Reaction(s): Other (See Comments)   Sulfa Antibiotics Nausea Only   Sulfamethoxazole-Trimethoprim Rash    Other Reaction(s): Other (See Comments)    ROS    Physical Exam: There were no vitals filed for this visit.  General: The patient is alert and oriented x3 in no acute distress.  Dermatology: Lesions to the left second and third toes appear healed.  No inflammation or redness today. Mild callus to the ends of the effected toes.  Vascular: Palpable pedal pulses bilaterally. Capillary refill within normal limits.  No diffuse appreciable edema.    Neurological: Light touch sensation grossly intact bilateral feet.   Musculoskeletal Exam: Semirigid hammertoe contractures to lesser digits bilaterally with some DIPJ contracture and angulation to bilateral 2nd toes.  Osseous prominence to left posterior calcaneus at site of Achilles insertion.  Some mild tenderness on palpation  Radiographs: Left and right foot radiographs 3 views weightbearing 05/05/2023 Left foot: No acute fractures, Hammer toe contractures of 2nd and 3rd digits. Left 2nd  toe contracture of DIPJ with lateral angulation. Sclerotic changes to the calcaneus with spurring present.  No signs of osteolysis or cortical erosion at second toe. Right foot: No acute fractures, Hammer toe contractures of 2nd and 3rd digits. Left 2nd toe contracture of DIPJ with lateral angulation. Valgus drift of first toe. Calcaneal spurring present.  Assessment/Plan of Care: 1. Hammertoes of both feet   2. Heel spur, left      No orders of the defined types were placed in this encounter.  None  Discussed clinical findings with patient today.  Lesions to the toes appear healed today.  He does want more gel toe caps, does not want surgical intervention for the toes. Regarding the erythromelalgia, the patient cannot take aspirin due to liver cirrhosis.  I advised that he ask his managing doctor about if he thinks gabapentin would be useful  The patient is asking about the calcaneal heel spur in passing stating it mostly bothers him in certain shoes.  States that he is not interested in surgery right now.  Had a gel Achilles tendon sleeve dispensed the patient which may provide increased cushion in shoes  Encouraged patient to follow up as needed for this for further management.  Leronda Lewers L. Marchia Bond, AACFAS [facility]     [address]  [place], [place] [address]                Office [phone]  Fax [phone]

## 2023-05-19 NOTE — Patient Instructions (Signed)
 More silicone pads can be purchased from:  [email or web address]

## 2023-06-15 DIAGNOSIS — Z0000 Encounter for general adult medical examination without abnormal findings: Secondary | ICD-10-CM | POA: Diagnosis not present

## 2023-06-15 DIAGNOSIS — K760 Fatty (change of) liver, not elsewhere classified: Secondary | ICD-10-CM | POA: Diagnosis not present

## 2023-06-15 DIAGNOSIS — Z1331 Encounter for screening for depression: Secondary | ICD-10-CM | POA: Diagnosis not present

## 2023-06-15 DIAGNOSIS — R7301 Impaired fasting glucose: Secondary | ICD-10-CM | POA: Diagnosis not present

## 2023-06-15 DIAGNOSIS — I7381 Erythromelalgia: Secondary | ICD-10-CM | POA: Diagnosis not present

## 2023-06-15 DIAGNOSIS — K5730 Diverticulosis of large intestine without perforation or abscess without bleeding: Secondary | ICD-10-CM | POA: Diagnosis not present

## 2023-06-15 DIAGNOSIS — E7800 Pure hypercholesterolemia, unspecified: Secondary | ICD-10-CM | POA: Diagnosis not present

## 2023-06-15 DIAGNOSIS — Z1389 Encounter for screening for other disorder: Secondary | ICD-10-CM | POA: Diagnosis not present

## 2023-06-15 DIAGNOSIS — J449 Chronic obstructive pulmonary disease, unspecified: Secondary | ICD-10-CM | POA: Diagnosis not present

## 2023-06-15 DIAGNOSIS — Z23 Encounter for immunization: Secondary | ICD-10-CM | POA: Diagnosis not present

## 2023-06-15 DIAGNOSIS — Z125 Encounter for screening for malignant neoplasm of prostate: Secondary | ICD-10-CM | POA: Diagnosis not present

## 2023-07-04 DIAGNOSIS — D692 Other nonthrombocytopenic purpura: Secondary | ICD-10-CM | POA: Diagnosis not present

## 2023-07-04 DIAGNOSIS — L570 Actinic keratosis: Secondary | ICD-10-CM | POA: Diagnosis not present

## 2023-07-04 DIAGNOSIS — L821 Other seborrheic keratosis: Secondary | ICD-10-CM | POA: Diagnosis not present

## 2023-07-04 DIAGNOSIS — D229 Melanocytic nevi, unspecified: Secondary | ICD-10-CM | POA: Diagnosis not present

## 2023-07-20 DIAGNOSIS — M65322 Trigger finger, left index finger: Secondary | ICD-10-CM | POA: Diagnosis not present

## 2023-07-20 DIAGNOSIS — M65341 Trigger finger, right ring finger: Secondary | ICD-10-CM | POA: Diagnosis not present

## 2023-08-09 DIAGNOSIS — M5136 Other intervertebral disc degeneration, lumbar region with discogenic back pain only: Secondary | ICD-10-CM | POA: Diagnosis not present

## 2023-08-09 DIAGNOSIS — M160 Bilateral primary osteoarthritis of hip: Secondary | ICD-10-CM | POA: Diagnosis not present

## 2023-09-01 DIAGNOSIS — M1611 Unilateral primary osteoarthritis, right hip: Secondary | ICD-10-CM | POA: Diagnosis not present

## 2023-09-14 DIAGNOSIS — M65321 Trigger finger, right index finger: Secondary | ICD-10-CM | POA: Diagnosis not present

## 2023-09-19 DIAGNOSIS — R0789 Other chest pain: Secondary | ICD-10-CM | POA: Diagnosis not present

## 2023-09-26 ENCOUNTER — Ambulatory Visit
Admission: RE | Admit: 2023-09-26 | Discharge: 2023-09-26 | Disposition: A | Discharge status: Home / Self Care | Source: Ambulatory Visit | Attending: Family Medicine | Admitting: Family Medicine

## 2023-09-26 ENCOUNTER — Other Ambulatory Visit: Payer: Self-pay | Admitting: Family Medicine

## 2023-09-26 DIAGNOSIS — R059 Cough, unspecified: Secondary | ICD-10-CM

## 2023-09-26 DIAGNOSIS — J988 Other specified respiratory disorders: Secondary | ICD-10-CM | POA: Diagnosis not present

## 2023-09-26 DIAGNOSIS — R918 Other nonspecific abnormal finding of lung field: Secondary | ICD-10-CM | POA: Diagnosis not present

## 2023-09-26 DIAGNOSIS — J439 Emphysema, unspecified: Secondary | ICD-10-CM | POA: Diagnosis not present

## 2023-10-20 ENCOUNTER — Other Ambulatory Visit: Payer: Self-pay | Admitting: Nurse Practitioner

## 2023-10-20 DIAGNOSIS — K7469 Other cirrhosis of liver: Secondary | ICD-10-CM | POA: Diagnosis not present

## 2023-10-20 DIAGNOSIS — K760 Fatty (change of) liver, not elsewhere classified: Secondary | ICD-10-CM | POA: Diagnosis not present

## 2023-10-20 DIAGNOSIS — M25511 Pain in right shoulder: Secondary | ICD-10-CM | POA: Diagnosis not present

## 2023-10-20 DIAGNOSIS — M25512 Pain in left shoulder: Secondary | ICD-10-CM | POA: Diagnosis not present

## 2023-10-24 ENCOUNTER — Ambulatory Visit
Admission: RE | Admit: 2023-10-24 | Discharge: 2023-10-24 | Disposition: A | Discharge status: Home / Self Care | Source: Ambulatory Visit | Attending: Nurse Practitioner | Admitting: Nurse Practitioner

## 2023-10-24 DIAGNOSIS — K7689 Other specified diseases of liver: Secondary | ICD-10-CM | POA: Diagnosis not present

## 2023-10-24 DIAGNOSIS — K760 Fatty (change of) liver, not elsewhere classified: Secondary | ICD-10-CM

## 2023-10-24 DIAGNOSIS — K7469 Other cirrhosis of liver: Secondary | ICD-10-CM

## 2023-10-30 ENCOUNTER — Other Ambulatory Visit: Payer: Self-pay | Admitting: Family Medicine

## 2023-10-30 ENCOUNTER — Ambulatory Visit
Admission: RE | Admit: 2023-10-30 | Discharge: 2023-10-30 | Disposition: A | Discharge status: Home / Self Care | Source: Ambulatory Visit | Attending: Family Medicine | Admitting: Family Medicine

## 2023-10-30 DIAGNOSIS — J988 Other specified respiratory disorders: Secondary | ICD-10-CM

## 2023-10-30 DIAGNOSIS — J189 Pneumonia, unspecified organism: Secondary | ICD-10-CM | POA: Diagnosis not present

## 2023-11-01 ENCOUNTER — Other Ambulatory Visit: Payer: Self-pay | Admitting: Family Medicine

## 2023-11-01 DIAGNOSIS — J984 Other disorders of lung: Secondary | ICD-10-CM

## 2023-11-01 DIAGNOSIS — R9389 Abnormal findings on diagnostic imaging of other specified body structures: Secondary | ICD-10-CM

## 2023-11-08 DIAGNOSIS — R7401 Elevation of levels of liver transaminase levels: Secondary | ICD-10-CM | POA: Diagnosis not present

## 2023-11-08 DIAGNOSIS — K7469 Other cirrhosis of liver: Secondary | ICD-10-CM | POA: Diagnosis not present

## 2023-11-08 DIAGNOSIS — R768 Other specified abnormal immunological findings in serum: Secondary | ICD-10-CM | POA: Diagnosis not present

## 2023-11-09 ENCOUNTER — Ambulatory Visit
Admission: RE | Admit: 2023-11-09 | Discharge: 2023-11-09 | Disposition: A | Discharge status: Home / Self Care | Source: Ambulatory Visit | Attending: Family Medicine | Admitting: Family Medicine

## 2023-11-09 ENCOUNTER — Encounter: Payer: Self-pay | Admitting: Radiology

## 2023-11-09 DIAGNOSIS — R9389 Abnormal findings on diagnostic imaging of other specified body structures: Secondary | ICD-10-CM

## 2023-11-09 DIAGNOSIS — R911 Solitary pulmonary nodule: Secondary | ICD-10-CM | POA: Diagnosis not present

## 2023-11-09 DIAGNOSIS — J984 Other disorders of lung: Secondary | ICD-10-CM

## 2023-11-09 MED ORDER — IOPAMIDOL (ISOVUE-300) INJECTION 61%
75.0000 mL | Freq: Once | INTRAVENOUS | Status: AC | PRN
  Administered 2023-11-09: 75 mL via INTRAVENOUS

## 2023-11-10 DIAGNOSIS — Z23 Encounter for immunization: Secondary | ICD-10-CM | POA: Diagnosis not present

## 2023-11-15 ENCOUNTER — Other Ambulatory Visit (HOSPITAL_COMMUNITY): Payer: Self-pay | Admitting: Family Medicine

## 2023-11-15 DIAGNOSIS — R911 Solitary pulmonary nodule: Secondary | ICD-10-CM

## 2023-11-17 ENCOUNTER — Encounter (HOSPITAL_COMMUNITY): Admission: RE | Admit: 2023-11-17 | Source: Ambulatory Visit

## 2023-11-18 DIAGNOSIS — Z23 Encounter for immunization: Secondary | ICD-10-CM | POA: Diagnosis not present

## 2023-11-21 ENCOUNTER — Encounter (HOSPITAL_COMMUNITY)
Admission: RE | Admit: 2023-11-21 | Discharge: 2023-11-21 | Disposition: A | Discharge status: Home / Self Care | Source: Ambulatory Visit | Attending: Family Medicine | Admitting: Family Medicine

## 2023-11-21 DIAGNOSIS — R911 Solitary pulmonary nodule: Secondary | ICD-10-CM | POA: Diagnosis not present

## 2023-11-21 DIAGNOSIS — R918 Other nonspecific abnormal finding of lung field: Secondary | ICD-10-CM | POA: Diagnosis not present

## 2023-11-21 LAB — GLUCOSE, CAPILLARY: Glucose-Capillary: 95 mg/dL (ref 70–99)

## 2023-11-21 MED ORDER — FLUDEOXYGLUCOSE F - 18 (FDG) INJECTION
9.3500 | Freq: Once | INTRAVENOUS | Status: AC
Start: 2023-11-21 — End: 2023-11-21
  Administered 2023-11-21: 8.42 via INTRAVENOUS

## 2023-12-04 DIAGNOSIS — M549 Dorsalgia, unspecified: Secondary | ICD-10-CM | POA: Diagnosis not present

## 2023-12-04 DIAGNOSIS — M79642 Pain in left hand: Secondary | ICD-10-CM | POA: Diagnosis not present

## 2023-12-04 DIAGNOSIS — M79641 Pain in right hand: Secondary | ICD-10-CM | POA: Diagnosis not present

## 2023-12-04 DIAGNOSIS — M2550 Pain in unspecified joint: Secondary | ICD-10-CM | POA: Diagnosis not present

## 2023-12-04 DIAGNOSIS — M25511 Pain in right shoulder: Secondary | ICD-10-CM | POA: Diagnosis not present

## 2023-12-04 DIAGNOSIS — M25551 Pain in right hip: Secondary | ICD-10-CM | POA: Diagnosis not present

## 2023-12-04 DIAGNOSIS — M25512 Pain in left shoulder: Secondary | ICD-10-CM | POA: Diagnosis not present

## 2023-12-04 DIAGNOSIS — M353 Polymyalgia rheumatica: Secondary | ICD-10-CM | POA: Diagnosis not present

## 2023-12-11 ENCOUNTER — Ambulatory Visit (INDEPENDENT_AMBULATORY_CARE_PROVIDER_SITE_OTHER)

## 2023-12-11 VITALS — BP 124/58 | HR 86 | Temp 98.0°F | Ht 72.0 in | Wt 172.0 lb

## 2023-12-11 DIAGNOSIS — R918 Other nonspecific abnormal finding of lung field: Secondary | ICD-10-CM | POA: Diagnosis not present

## 2023-12-11 DIAGNOSIS — R942 Abnormal results of pulmonary function studies: Secondary | ICD-10-CM

## 2023-12-11 DIAGNOSIS — Z87891 Personal history of nicotine dependence: Secondary | ICD-10-CM

## 2023-12-11 DIAGNOSIS — J439 Emphysema, unspecified: Secondary | ICD-10-CM

## 2023-12-11 NOTE — Patient Instructions (Addendum)
 It was a pleasure to see you today. Your pulmonary function test will be scheduled closer to your next follow up visit. Your will receive a call for your CT chest scheduling. We will call you once we have the results of the CT chest.

## 2023-12-11 NOTE — Assessment & Plan Note (Signed)
 Discussed the symptoms, etiology, pathophysiology, diagnostic test, treatment, flare ups,  prognosis of copd emphysema on ct chest Orders:   Pulmonary function test; Future

## 2023-12-11 NOTE — Progress Notes (Signed)
 New Patient Pulmonology Office Visit   Subjective:  Patient ID: Stephen Gaines, male    DOB: Jul 09, 1950  MRN: [id number]  Referred by: Stephen Kins, MD  CC:  Chief Complaint  Patient presents with   Consult    Pulmonary nodule.  Review CT and PET scans in September 2025.      HPI Stephen Gaines is a 73 y.o. male who is referred to this clinic for abnormal CT chest. Patient and family report that he had pneumonialike symptoms in July which prompted a chest x-ray and treatment.  A short-term follow-up chest x-ray within 4 weeks revealed persistent nodules. Hence a CT chest was performed on September 08/2023, it showed 2 new nodules and right upper lobe and left upper lobe compared to priors screening CT chest from 2022.  This scan also showed previously known biapical scarring.  Subsequent PET/CT on September 17/2025 significant uptake in biapical thickening, posteromedial right upper lobe pleural parenchymal soft tissue density, medial right infrahilar region, and also within the new bilateral upper lobe pulmonary nodules.  The PET also also revealed mild lymphadenopathy in the gastrohepatic ligament with mild hypermetabolism.  Of note he has a history of cirrhosis and undergoes regular abdominal ultrasound through GI clinic.  Patient is a former smoker with more than 50-pack-year smoking history, quit in 2007.  He lives with his wife.  Patient is retired, used to work in [employer] previously his daughter had colorectal cancer and father had lung cancer  He denies weight loss.  Denies hemoptysis He has known history of emphysema. Reports nonproductive cough. Denies other pulmonary symptoms, Walks 3 miles every day    ROS Review of symptoms negative except mentioned above   Allergies: Codeine, Sulfamethoxazole, Other, Sulfa antibiotics, and Sulfamethoxazole-trimethoprim  Current Outpatient Medications:    MILK THISTLE PO, Take 1 tablet by mouth daily., Disp: , Rfl:    Multiple  Vitamin (MULTIVITAMIN ADULT PO), Take 1 tablet by mouth daily., Disp: , Rfl:    predniSONE (DELTASONE) 5 MG tablet, Take 5 mg by mouth as directed., Disp: , Rfl:  No past medical history on file. No past surgical history on file. No family history on file. Social History   Socioeconomic History   Marital status: Married    Spouse name: Not on file   Number of children: Not on file   Years of education: Not on file   Highest education level: Not on file  Occupational History   Not on file  Tobacco Use   Smoking status: Former    Current packs/day: 0.00    Average packs/day: 1.5 packs/day for 45.0 years (67.5 ttl pk-yrs)    Types: Cigarettes    Start date: 03/07/1960    Quit date: 03/07/2005    Years since quitting: 18.7   Smokeless tobacco: Never   Tobacco comments:    Encouraged to remain smoke free  Substance and Sexual Activity   Alcohol use: Not on file   Drug use: Not on file   Sexual activity: Not on file  Other Topics Concern   Not on file  Social History Narrative   Not on file   Social Drivers of Health   Financial Resource Strain: Not on file  Food Insecurity: Low Risk  (10/17/2022)   Received from [facility]   Hunger Vital Sign    Within the past 12 months, you worried that your food would run out before you got money to buy more: Never true    Within the past  12 months, the food you bought just didn't last and you didn't have money to get more. : Never true  Transportation Needs: Not on file (10/17/2022)  Physical Activity: Not on file  Stress: Not on file  Social Connections: Not on file  Intimate Partner Violence: Not on file         Objective:  BP (!) 124/58   Pulse 86   Temp 98 F (36.7 C) (Oral)   Ht 6' (1.829 m)   Wt 172 lb (78 kg)   SpO2 97% Comment: room air  BMI 23.33 kg/m    Physical Exam Constitutional:      General: He is not in acute distress.    Appearance: Normal appearance.  HENT:     Mouth/Throat:     Mouth: Mucous  membranes are moist.  Cardiovascular:     Rate and Rhythm: Normal rate.  Pulmonary:     Effort: No respiratory distress.     Breath sounds: No wheezing or rales.  Musculoskeletal:     Right lower leg: No edema.     Left lower leg: No edema.  Skin:    General: Skin is warm.  Neurological:     Mental Status: He is alert and oriented to person, place, and time.  Psychiatric:        Mood and Affect: Mood normal.     Diagnostic Review:    Pft     No data to display             I personally reviewed lung cancer screening CT chest the from 2020, 2021, 2022.  Reviewed chest x-ray from July and August 2025 Also reviewed and compared those CT with recent CT chest from September 2025 and PET/CT from September 2025.    Assessment & Plan:   Assessment & Plan Lung nodules  Orders:   CT SUPER D CHEST WO CONTRAST; Future  Multiple lung nodules on CT  Orders:   CT SUPER D CHEST WO CONTRAST; Future  Abnormal PET scan of lung  Orders:   CT SUPER D CHEST WO CONTRAST; Future  Chronic obstructive pulmonary disease with emphysema, unspecified emphysema type (HCC) Discussed the symptoms, etiology, pathophysiology, diagnostic test, treatment, flare ups,  prognosis of copd emphysema on ct chest Orders:   Pulmonary function test; Future  Former smoker Risk for COPD Congratulated on quitting smoking Orders:   Pulmonary function test; Future   I reviewed prior and current scans with patient and his wife and clinic today.  He has known biapical pleuroparenchymal scarring that has remained stable on screening CT chest.  Interestingly these areas are significantly PET avid .  In addition the new lung nodules have concerning appearance and are PET avid as well.   we do not have any comparison scans between 2022 and now.  Since  patient had pneumonialike symptoms and needed treatment in July 2025, these nodules could  be inflammatory and could be infection related.   Prior smoking  history, family history of cancer and his age put him at high risk for lung cancer.  Hence I would recommend a short interval CT chest to ensure atleast some improvement on the nodular infiltrates, especially since he had pneumonia 2-3 months months ago and would expect radiologic improvement by now.  If they are still persistent or worsening, given high risk for lung cancer, I would advise proceeding with biopsy rather than waiting another 3 months for follow up scan. I explained the bronchoscopic biopsy procedure at  depth.  We discussed about other potential options including continued follow up, CT guided biopsy, surgical biopsy. Risks and benefits of bronchoscopy +/- biopsy discussed with the patient and his family in details and all the questions were answered. They understand the risk of bleeding, pneumothorax, injury to blood vessels and would like to proceed for the bronchoscopy with biopsy if deemed necessary based on next ct chest.    Thank you for the opportunity to take part in the care of Zacheriah Stumpe   Return in about 3 weeks (around 01/01/2024).   Azzam Mehra Pleas, MD [facility] Office: [phone]  I personally spent a total of 60 minutes in the care of the patient today including performing a medically appropriate exam/evaluation, counseling and educating, placing orders, documenting clinical information in the EHR, independently interpreting results, and communicating results.

## 2023-12-12 ENCOUNTER — Other Ambulatory Visit: Payer: Self-pay

## 2023-12-12 DIAGNOSIS — R942 Abnormal results of pulmonary function studies: Secondary | ICD-10-CM

## 2023-12-12 DIAGNOSIS — R911 Solitary pulmonary nodule: Secondary | ICD-10-CM

## 2023-12-14 ENCOUNTER — Inpatient Hospital Stay: Admission: RE | Admit: 2023-12-14 | Source: Ambulatory Visit

## 2023-12-14 ENCOUNTER — Ambulatory Visit
Admission: RE | Admit: 2023-12-14 | Discharge: 2023-12-14 | Disposition: A | Discharge status: Home / Self Care | Source: Ambulatory Visit

## 2023-12-14 DIAGNOSIS — R918 Other nonspecific abnormal finding of lung field: Secondary | ICD-10-CM

## 2023-12-14 DIAGNOSIS — J432 Centrilobular emphysema: Secondary | ICD-10-CM | POA: Diagnosis not present

## 2023-12-14 DIAGNOSIS — R942 Abnormal results of pulmonary function studies: Secondary | ICD-10-CM

## 2023-12-21 ENCOUNTER — Telehealth: Payer: Self-pay

## 2023-12-21 NOTE — Telephone Encounter (Signed)
 The ct chest overall looks improved from prior one. He still has scarring, emphysema and scattered nodules but the 2 largest/new concerning nodules are smaller/improving on current CT chest which supports that this may be resolving infection rather than cancer on those nodules. I will review the scans and discuss follow up plans with them in next clinic visit.

## 2023-12-21 NOTE — Telephone Encounter (Signed)
 Copied from CRM #[id number]. Topic: Clinical - Lab/Test Results >> Dec 20, 2023  4:39 PM Rozanna G wrote: Reason for CRM: Pt calling to get his results from his CT would like a call back  Please advise on ct results

## 2023-12-22 NOTE — Telephone Encounter (Signed)
**Note De-identified  [place] Obfuscation** Please advise 

## 2023-12-22 NOTE — Telephone Encounter (Signed)
 I called and spoke with patient/wife, provided results per Dr. Pleas.  Advised I would send the results to his mychart as well so he would not have to take notes while we were talking on the phone.  They were appreciative.  Results sent to mychart.He has a PFT on 10/27 and OV on 10/29.  Nothing further needed.

## 2023-12-22 NOTE — Telephone Encounter (Signed)
 Wouldn't advise antibiotics at this point. I dont see new pneumonia in lungs. Antibiotics at the time of active infection is necessary.  But antibiotics this far don't help clear the residual lung shadows faster- lung shadows from infection clear over time and sometime residual scarring persist in lungs but the small residual scarring in lungs in these scenarios shouldn't affect breathing overall.  However if he has new infection signs like cough, productive sputum, fever or chills, would advise a visit with PCP/urgent care before taking any additional antibiotics.

## 2024-01-01 ENCOUNTER — Encounter

## 2024-01-03 ENCOUNTER — Ambulatory Visit

## 2024-01-08 DIAGNOSIS — K7581 Nonalcoholic steatohepatitis (NASH): Secondary | ICD-10-CM | POA: Diagnosis not present

## 2024-01-08 DIAGNOSIS — M353 Polymyalgia rheumatica: Secondary | ICD-10-CM | POA: Diagnosis not present

## 2024-01-08 DIAGNOSIS — R911 Solitary pulmonary nodule: Secondary | ICD-10-CM | POA: Diagnosis not present

## 2024-01-08 DIAGNOSIS — M1990 Unspecified osteoarthritis, unspecified site: Secondary | ICD-10-CM | POA: Diagnosis not present

## 2024-01-10 DIAGNOSIS — H25813 Combined forms of age-related cataract, bilateral: Secondary | ICD-10-CM | POA: Diagnosis not present

## 2024-01-10 DIAGNOSIS — H04123 Dry eye syndrome of bilateral lacrimal glands: Secondary | ICD-10-CM | POA: Diagnosis not present

## 2024-01-12 DIAGNOSIS — M810 Age-related osteoporosis without current pathological fracture: Secondary | ICD-10-CM | POA: Diagnosis not present

## 2024-01-19 ENCOUNTER — Ambulatory Visit (HOSPITAL_BASED_OUTPATIENT_CLINIC_OR_DEPARTMENT_OTHER)

## 2024-01-19 ENCOUNTER — Ambulatory Visit (INDEPENDENT_AMBULATORY_CARE_PROVIDER_SITE_OTHER)

## 2024-01-19 VITALS — BP 122/58 | HR 80 | Temp 97.8°F | Ht 72.0 in | Wt 174.0 lb

## 2024-01-19 DIAGNOSIS — I2510 Atherosclerotic heart disease of native coronary artery without angina pectoris: Secondary | ICD-10-CM

## 2024-01-19 DIAGNOSIS — Z87891 Personal history of nicotine dependence: Secondary | ICD-10-CM | POA: Diagnosis not present

## 2024-01-19 DIAGNOSIS — R053 Chronic cough: Secondary | ICD-10-CM

## 2024-01-19 DIAGNOSIS — J439 Emphysema, unspecified: Secondary | ICD-10-CM | POA: Diagnosis not present

## 2024-01-19 DIAGNOSIS — R918 Other nonspecific abnormal finding of lung field: Secondary | ICD-10-CM

## 2024-01-19 DIAGNOSIS — J984 Other disorders of lung: Secondary | ICD-10-CM

## 2024-01-19 DIAGNOSIS — J438 Other emphysema: Secondary | ICD-10-CM

## 2024-01-19 DIAGNOSIS — R942 Abnormal results of pulmonary function studies: Secondary | ICD-10-CM | POA: Diagnosis not present

## 2024-01-19 LAB — PULMONARY FUNCTION TEST
DL/VA % pred: 62 %
DL/VA: 2.46 ml/min/mmHg/L
DLCO unc % pred: 57 %
DLCO unc: 15.43 ml/min/mmHg
FEF 25-75 Post: 2.32 L/s
FEF 25-75 Pre: 1.73 L/s
FEF2575-%Change-Post: 34 %
FEF2575-%Pred-Post: 94 %
FEF2575-%Pred-Pre: 70 %
FEV1-%Change-Post: 6 %
FEV1-%Pred-Post: 96 %
FEV1-%Pred-Pre: 90 %
FEV1-Post: 3.26 L
FEV1-Pre: 3.05 L
FEV1FVC-%Change-Post: 4 %
FEV1FVC-%Pred-Pre: 90 %
FEV6-%Change-Post: 2 %
FEV6-%Pred-Post: 108 %
FEV6-%Pred-Pre: 106 %
FEV6-Post: 4.73 L
FEV6-Pre: 4.61 L
FEV6FVC-%Pred-Post: 106 %
FEV6FVC-%Pred-Pre: 106 %
FVC-%Change-Post: 2 %
FVC-%Pred-Post: 102 %
FVC-%Pred-Pre: 100 %
FVC-Post: 4.73 L
FVC-Pre: 4.62 L
Post FEV1/FVC ratio: 69 %
Post FEV6/FVC ratio: 100 %
Pre FEV1/FVC ratio: 66 %
Pre FEV6/FVC Ratio: 100 %
RV % pred: 111 %
RV: 2.95 L
TLC % pred: 99 %
TLC: 7.41 L

## 2024-01-19 NOTE — Patient Instructions (Signed)
 Full PFT performed today.

## 2024-01-19 NOTE — Patient Instructions (Signed)
 It was a pleasure to see you today. Your will receive a call for your PET/CT scheduling.  We will review/discuss results in next visit

## 2024-01-19 NOTE — Assessment & Plan Note (Addendum)
 From smoking stable

## 2024-01-19 NOTE — Progress Notes (Signed)
 Full PFT performed today.

## 2024-01-19 NOTE — Progress Notes (Signed)
 [facility] Office Visit   Subjective:  Stephen Gaines ID: Stephen Gaines, male    DOB: [birth year]  MRN: [id number]  Referred by: Pleas Newborn, MD  CC:  Chief Complaint  Stephen Gaines presents with   COPD    PFT- clearing of throat.     HPI 12/11/2023  COPD Stephen Gaines past medical history is significant for COPD.   Stephen Gaines is a 73 y.o. male who is referred to this clinic for abnormal CT chest. Stephen Gaines and family report that Stephen Gaines had pneumonialike symptoms in July which prompted a chest x-ray and treatment.  A short-term follow-up chest x-ray within 4 weeks revealed persistent nodules. Hence a CT chest was performed on September 08/2023, it showed 2 new nodules and right upper lobe and left upper lobe compared to priors screening CT chest from 2022.  This scan also showed previously known biapical scarring.  Subsequent PET/CT on September 17/2025 significant uptake in biapical thickening, posteromedial right upper lobe pleural parenchymal soft tissue density, medial right infrahilar region, and also within the new bilateral upper lobe pulmonary nodules.  The PET also also revealed mild lymphadenopathy in the gastrohepatic ligament with mild hypermetabolism.  Of note Stephen Gaines has a history of cirrhosis and undergoes regular abdominal ultrasound through GI clinic.  Stephen Gaines is a former smoker with more than 50-pack-year smoking history, quit in 2007.  Stephen Gaines lives with Stephen Gaines.  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  Denies hemoptysis Stephen Gaines has known history of emphysema. Reports nonproductive cough. Denies other pulmonary symptoms, Walks 3 miles every day  Interval hx: 01/19/2024 Completed CT chest, PFT Here to review results Reports throat clearing and mild cough  Denies shortness of breath, wheezing  ROS Review of symptoms negative except mentioned above   Allergies: Codeine,  Sulfamethoxazole, Other, Sulfa antibiotics, and Sulfamethoxazole-trimethoprim  Current Outpatient Medications:    albuterol (VENTOLIN HFA) 108 (90 Base) MCG/ACT inhaler, Inhale 2 puffs into the lungs every 6 (six) hours as needed., Disp: , Rfl:    alendronate (FOSAMAX) 70 MG tablet, Take 70 mg by mouth once a week., Disp: , Rfl:    Loratadine (CLARITIN) 10 MG CAPS, as needed., Disp: , Rfl:    MILK THISTLE PO, Take 1 tablet by mouth daily., Disp: , Rfl:    Multiple Vitamin (MULTIVITAMIN ADULT PO), Take 1 tablet by mouth daily., Disp: , Rfl:    predniSONE (DELTASONE) 5 MG tablet, Take 5 mg by mouth as directed., Disp: , Rfl:  No past medical history on file. No past surgical history on file. No family history on file. Social History   Socioeconomic History   Marital status: Married    Spouse name: Not on file   Number of children: Not on file   Years of education: Not on file   Highest education level: Not on file  Occupational History   Not on file  Tobacco Use   Smoking status: Former    Current packs/day: 0.00    Average packs/day: 1.5 packs/day for 45.0 years (67.5 ttl pk-yrs)    Types: Cigarettes    Start date: 03/07/1960    Quit date: 03/07/2005    Years since quitting: 18.8   Smokeless tobacco: Never   Tobacco comments:    Encouraged to remain smoke free  Substance and Sexual Activity   Alcohol use: Not on file   Drug use: Not on file   Sexual  activity: Not on file  Other Topics Concern   Not on file  Social History Narrative   Not on file   Social Drivers of Health   Financial Resource Strain: Not on file  Food Insecurity: Low Risk  (10/17/2022)   Received from [facility]   Hunger Vital Sign    Within the past 12 months, you worried that your food would run out before you got money to buy more: Never true    Within the past 12 months, the food you bought just didn't last and you didn't have money to get more. : Never true  Transportation Needs: No Transportation  Needs (10/17/2022)   Received from [facility]    In the past 12 months, has lack of reliable transportation kept you from medical appointments, meetings, work or from getting things needed for daily living? : No  Physical Activity: Not on file  Stress: Not on file  Social Connections: Not on file  Intimate Partner Violence: Not on file         Objective:  BP (!) 122/58 (BP Location: Left Arm, Stephen Gaines Position: Sitting)   Pulse 80   Temp 97.8 F (36.6 C) (Oral)   Ht 6' (1.829 m)   Wt 174 lb (78.9 kg)   SpO2 96%   BMI 23.60 kg/m    Physical Exam Constitutional:      General: Stephen Gaines is not in acute distress.    Appearance: Normal appearance.  HENT:     Mouth/Throat:     Mouth: Mucous membranes are moist.  Cardiovascular:     Rate and Rhythm: Normal rate.  Pulmonary:     Effort: No respiratory distress.     Breath sounds: No wheezing or rales.  Musculoskeletal:     Right lower leg: No edema.     Left lower leg: No edema.  Skin:    General: Skin is warm.  Neurological:     Mental Status: Stephen Gaines is alert and oriented to person, place, and time.  Psychiatric:        Mood and Affect: Mood normal.     Diagnostic Review:    Pft    Latest Ref Rng & Units 01/19/2024    8:10 AM  PFT Results  FVC-Pre L 4.62  P  FVC-Predicted Pre % 100  P  FVC-Post L 4.73  P  FVC-Predicted Post % 102  P  Pre FEV1/FVC % % 66  P  Post FEV1/FCV % % 69  P  FEV1-Pre L 3.05  P  FEV1-Predicted Pre % 90  P  FEV1-Post L 3.26  P  DLCO uncorrected ml/min/mmHg 15.43  P  DLCO [facility]C% % 57  P  DLVA Predicted % 62  P  TLC L 7.41  P  TLC % Predicted % 99  P  RV % Predicted % 111  P    P Preliminary result   Mild obstruction, no BD response Reduced DLCO     I personally reviewed lung cancer screening CT chest the from 2020, 2021, 2022.  Reviewed chest x-ray from July and August 2025 Also reviewed and compared those CT with recent CT chest from September 2025 and PET/CT from  September 2025.    Assessment & Plan:   Assessment & Plan Abnormal PET scan of lung The left upper lobe nodule and right upper lobe nodule that were new- were resolving on last ct chest 12/2023 compared to ct chest 11/2023 Stephen Gaines still has chronic bi-apical scarring-  these were significantly PET avid. 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 Will continue to follow    Lung nodules Reviewed prior and current scans with Stephen Gaines and Gaines Stable/improving Needs continued monitoring due to smoking hx Orders:   NM PET Image Initial (PI) Skull Base To Thigh; Future  Former smoker Congratulated on quitting smoking PFT shows mild obstruction by volume curves, mild reduced DLCO Orders:   NM PET Image Initial (PI) Skull Base To Thigh; Future   Ambulatory referral to Cardiology  Other emphysema [facility]) From smoking stable    Coronary artery calcification Will refer to cardiology given age and significant smoking hx No known cardiac hx  Orders:   Ambulatory referral to Cardiology  Chronic cough Stephen Gaines will try antihistaminics and flonase for 3 weeks      Thank you for the opportunity to take part in the care of Edel Rivero   Return in about 5 months (around 06/24/2024).   Larayah Clute Pleas, MD [facility] Office: [phone]  I personally spent a total of 40 minutes in the care of the Stephen Gaines today including performing a medically appropriate exam/evaluation, counseling and educating, placing orders, documenting clinical information in the EHR, independently interpreting results, and communicating results.

## 2024-02-12 ENCOUNTER — Other Ambulatory Visit: Payer: Self-pay | Admitting: Medical Genetics

## 2024-02-14 ENCOUNTER — Encounter (HOSPITAL_COMMUNITY)

## 2024-02-15 DIAGNOSIS — M65341 Trigger finger, right ring finger: Secondary | ICD-10-CM | POA: Diagnosis not present

## 2024-02-15 DIAGNOSIS — M65322 Trigger finger, left index finger: Secondary | ICD-10-CM | POA: Diagnosis not present

## 2024-02-15 DIAGNOSIS — M65321 Trigger finger, right index finger: Secondary | ICD-10-CM | POA: Diagnosis not present

## 2024-03-11 ENCOUNTER — Telehealth: Payer: Self-pay

## 2024-03-11 NOTE — Telephone Encounter (Signed)
 Copied from CRM [id number]. Topic: General - Registration Update >> Mar 11, 2024  9:07 AM Isabell A wrote: Patient/patient representative is calling to make an update to registration.    Patient has updated his insurance to [facility] - would like to confirm his PET scan will be authorized and approved through the new ins.   Requesting a call back [phone]   [facility] Medicare has been updated and the eligibility has been scanned into the patient documents.  Could you please verify that patient's PET is authorized through [facility]?

## 2024-04-09 ENCOUNTER — Other Ambulatory Visit: Payer: Self-pay | Admitting: Medical Genetics

## 2024-04-09 DIAGNOSIS — Z006 Encounter for examination for normal comparison and control in clinical research program: Secondary | ICD-10-CM

## 2024-04-11 ENCOUNTER — Other Ambulatory Visit: Payer: Self-pay

## 2024-06-27 ENCOUNTER — Other Ambulatory Visit (HOSPITAL_COMMUNITY)

## 2024-07-11 ENCOUNTER — Ambulatory Visit
